# Patient Record
Sex: Female | Born: 1950 | Race: Black or African American | Hispanic: No | Smoking: Never smoker
Health system: Southern US, Community
[De-identification: ages and names within clinical notes are randomized; demographics above are authoritative.]

## PROBLEM LIST (undated history)

## (undated) DIAGNOSIS — Z8719 Personal history of other diseases of the digestive system: Secondary | ICD-10-CM

## (undated) DIAGNOSIS — J189 Pneumonia, unspecified organism: Secondary | ICD-10-CM

## (undated) DIAGNOSIS — D649 Anemia, unspecified: Secondary | ICD-10-CM

## (undated) DIAGNOSIS — K219 Gastro-esophageal reflux disease without esophagitis: Secondary | ICD-10-CM

## (undated) DIAGNOSIS — K8681 Exocrine pancreatic insufficiency: Secondary | ICD-10-CM

## (undated) DIAGNOSIS — R0600 Dyspnea, unspecified: Secondary | ICD-10-CM

## (undated) HISTORY — DX: Anemia, unspecified: D64.9

## (undated) HISTORY — DX: Gastro-esophageal reflux disease without esophagitis: K21.9

---

## 1994-06-17 HISTORY — PX: TONSILLECTOMY: SUR1361

## 1996-06-17 HISTORY — PX: BREAST SURGERY: SHX581

## 1996-06-17 HISTORY — PX: BREAST EXCISIONAL BIOPSY: SUR124

## 1996-06-17 HISTORY — PX: ABDOMINAL HYSTERECTOMY: SHX81

## 2004-09-04 ENCOUNTER — Ambulatory Visit: Payer: Self-pay

## 2005-12-09 ENCOUNTER — Ambulatory Visit: Payer: Self-pay | Admitting: Family Medicine

## 2005-12-10 ENCOUNTER — Ambulatory Visit: Payer: Self-pay | Admitting: Gastroenterology

## 2007-03-28 ENCOUNTER — Ambulatory Visit: Payer: Self-pay | Admitting: Family Medicine

## 2009-06-17 HISTORY — PX: COLONOSCOPY: SHX174

## 2010-02-02 ENCOUNTER — Inpatient Hospital Stay: Payer: Self-pay | Admitting: *Deleted

## 2010-04-05 ENCOUNTER — Ambulatory Visit: Payer: Self-pay | Admitting: Gastroenterology

## 2011-03-23 ENCOUNTER — Emergency Department: Payer: Self-pay | Admitting: Unknown Physician Specialty

## 2012-05-25 ENCOUNTER — Ambulatory Visit: Payer: Self-pay | Admitting: Family Medicine

## 2012-06-24 ENCOUNTER — Ambulatory Visit: Payer: Self-pay | Admitting: Family Medicine

## 2013-08-20 ENCOUNTER — Ambulatory Visit: Payer: Self-pay | Admitting: Family Medicine

## 2013-09-03 ENCOUNTER — Ambulatory Visit: Payer: Self-pay | Admitting: Family Medicine

## 2014-09-14 ENCOUNTER — Ambulatory Visit
Admit: 2014-09-14 | Disposition: A | Discharge status: Home / Self Care | Payer: Self-pay | Attending: Family Medicine | Admitting: Family Medicine

## 2014-09-26 ENCOUNTER — Ambulatory Visit
Admit: 2014-09-26 | Disposition: A | Discharge status: Home / Self Care | Payer: Self-pay | Attending: Family Medicine | Admitting: Family Medicine

## 2014-10-05 ENCOUNTER — Encounter: Payer: Self-pay | Admitting: *Deleted

## 2014-10-17 ENCOUNTER — Other Ambulatory Visit: Payer: 59

## 2014-10-17 ENCOUNTER — Encounter: Payer: Self-pay | Admitting: General Surgery

## 2014-10-17 ENCOUNTER — Ambulatory Visit (INDEPENDENT_AMBULATORY_CARE_PROVIDER_SITE_OTHER): Payer: 59 | Admitting: General Surgery

## 2014-10-17 VITALS — BP 116/68 | HR 72 | Resp 12 | Ht 65.0 in | Wt 168.0 lb

## 2014-10-17 DIAGNOSIS — N6002 Solitary cyst of left breast: Secondary | ICD-10-CM | POA: Diagnosis not present

## 2014-10-17 HISTORY — PX: BREAST CYST ASPIRATION: SHX578

## 2014-10-17 NOTE — Progress Notes (Signed)
Patient ID: Trace J Lievanos, female   DOB: [birth year], [age][name] y.o.   MRN: [id number]  Chief Complaint  Patient presents with  . Other    cyst on left breast    HPI Shon HoughBarbara J Blane is a [age] y.o. female here for evaluation of a left breast cyst. Her last mammogram was done on 09/26/14. She has a family history of breast cancer. Patient states she did not feel cyst it was seen on the ultrasound. She says her breast just feel heavier.  HPI  Past Medical History  Diagnosis Date  . GERD (gastroesophageal reflux disease)   . Anemia     Past Surgical History  Procedure Laterality Date  . Tonsillectomy  1996  . Abdominal hysterectomy  1998    partial  . Colonoscopy  2011  . Breast surgery Left 1998    removal of 2 fibrocystic masses    Family History  Problem Relation Age of Onset  . Cancer Mother     pancreatic  . Alzheimer's disease Father   . Cancer Sister     lung  . Cancer Brother     lung  . Breast cancer Sister     2261    Social History History  Substance Use Topics  . Smoking status: Never Smoker   . Smokeless tobacco: Never Used  . Alcohol Use: No    No Known Allergies  Current Outpatient Prescriptions  Medication Sig Dispense Refill  . esomeprazole (NEXIUM) 20 MG capsule Take 20 mg by mouth daily at 12 noon.    . Omega-3 Fatty Acids (FP FISH OIL PO) Take 1 capsule by mouth every other day.      No current facility-administered medications for this visit.    Review of Systems Review of Systems  Constitutional: Negative.   Respiratory: Negative.   Cardiovascular: Negative.     Blood pressure 116/68, pulse 72, resp. rate 12, height 5\' 5"  (1.651 m), weight 168 lb (76.204 kg).  Physical Exam Physical Exam  Constitutional: She is oriented to person, place, and time. She appears well-developed and well-nourished.  Eyes: Conjunctivae are normal. No scleral icterus.  Neck: Neck supple.  Cardiovascular: Normal rate, regular rhythm and normal heart sounds.    Pulmonary/Chest: Effort normal and breath sounds normal. Right breast exhibits no inverted nipple, no mass, no nipple discharge, no skin change and no tenderness. Left breast exhibits no inverted nipple, no mass, no nipple discharge, no skin change and no tenderness.  Lymphadenopathy:    She has no cervical adenopathy.    She has no axillary adenopathy.  Neurological: She is alert and oriented to person, place, and time.    Data Reviewed Mammogram and Ultrasound reviewed. 7 mm anechoic mass left breast 8 o'clock. Suspected to be a cyst.   Assessment    Left breast cyst. Patient was concerned given her family history.     Plan    With consent the left breast cyst was aspirated and resolved with ultrasound guidance.  Recheck in 3 months with ultrasound.     Ref/PCP: Dr Ilene QuaHawkins   SANKAR,SEEPLAPUTHUR G 10/18/2014, 5:57 AM

## 2014-10-18 ENCOUNTER — Encounter: Payer: Self-pay | Admitting: General Surgery

## 2014-10-18 NOTE — Patient Instructions (Signed)
Call for any problems or questions.

## 2014-12-14 ENCOUNTER — Encounter: Payer: Self-pay | Admitting: *Deleted

## 2015-02-05 ENCOUNTER — Emergency Department
Admission: EM | Admit: 2015-02-05 | Discharge: 2015-02-05 | Disposition: A | Discharge status: Home / Self Care | Payer: 59 | Attending: Emergency Medicine | Admitting: Emergency Medicine

## 2015-02-05 DIAGNOSIS — IMO0001 Reserved for inherently not codable concepts without codable children: Secondary | ICD-10-CM

## 2015-02-05 DIAGNOSIS — M79641 Pain in right hand: Secondary | ICD-10-CM | POA: Diagnosis present

## 2015-02-05 DIAGNOSIS — L03011 Cellulitis of right finger: Secondary | ICD-10-CM | POA: Insufficient documentation

## 2015-02-05 MED ORDER — OXYCODONE-ACETAMINOPHEN 5-325 MG PO TABS: 1.0000 | ORAL_TABLET | ORAL | Status: DC | PRN

## 2015-02-05 MED ORDER — CEPHALEXIN 500 MG PO CAPS: 500.0000 mg | ORAL_CAPSULE | Freq: Three times a day (TID) | ORAL | Status: DC

## 2015-02-05 MED ORDER — SULFAMETHOXAZOLE-TRIMETHOPRIM 800-160 MG PO TABS: 1.0000 | ORAL_TABLET | Freq: Two times a day (BID) | ORAL | Status: DC

## 2015-02-05 MED ORDER — OXYCODONE-ACETAMINOPHEN 5-325 MG PO TABS
1.0000 | ORAL_TABLET | Freq: Once | ORAL | Status: AC
  Administered 2015-02-05: 1 via ORAL
  Filled 2015-02-05: qty 1

## 2015-02-05 NOTE — ED Notes (Addendum)
Patient to ED with c/o infection to 3rd digit right hand. Patient reports that this happened after a manicure and she has been using an anti-fungal.

## 2015-02-05 NOTE — ED Provider Notes (Signed)
[facility] Emergency Department Provider Note  ____________________________________________  Time seen: Approximately 8:40 AM  I have reviewed the triage vital signs and the nursing notes.   HISTORY  Chief Complaint Hand Pain   HPI Sophia Schmidt is a 64 y.o. female with infection third finger right hand.Patient states that she had a manicure and she does have acrylic nails. She began having pain right third finger and return to the nail salon. She was given some ointment to use which has not helped any at all. Today the pain is increased. She rates her pain a 10 out of 10 at present.   Past Medical History  Diagnosis Date  . GERD (gastroesophageal reflux disease)   . Anemia     There are no active problems to display for this patient.   Past Surgical History  Procedure Laterality Date  . Tonsillectomy  1996  . Abdominal hysterectomy  1998    partial  . Colonoscopy  2011  . Breast surgery Left 1998    removal of 2 fibrocystic masses    Current Outpatient Rx  Name  Route  Sig  Dispense  Refill  . cephALEXin (KEFLEX) 500 MG capsule   Oral   Take 1 capsule (500 mg total) by mouth 3 (three) times daily.   21 capsule   0   . esomeprazole (NEXIUM) 20 MG capsule   Oral   Take 20 mg by mouth daily at 12 noon.         . Omega-3 Fatty Acids (FP FISH OIL PO)   Oral   Take 1 capsule by mouth every other day.          . oxyCODONE-acetaminophen (PERCOCET) 5-325 MG per tablet   Oral   Take 1 tablet by mouth every 4 (four) hours as needed for severe pain.   20 tablet   0   . sulfamethoxazole-trimethoprim (BACTRIM DS,SEPTRA DS) 800-160 MG per tablet   Oral   Take 1 tablet by mouth 2 (two) times daily.   14 tablet   0     Allergies Review of patient's allergies indicates no known allergies.  Family History  Problem Relation Age of Onset  . Cancer Mother     pancreatic  . Alzheimer's disease Father   . Cancer Sister     lung  .  Cancer Brother     lung  . Breast cancer Sister     45    Social History Social History  Substance Use Topics  . Smoking status: Never Smoker   . Smokeless tobacco: Never Used  . Alcohol Use: No    Review of Systems Constitutional: No fever/chills ENT: No sore throat. Cardiovascular: Denies chest pain. Respiratory: Denies shortness of breath. Gastrointestinal: No abdominal pain.  No nausea, no vomiting.  Genitourinary: Negative for dysuria. Musculoskeletal: Negative for back pain. Skin: Negative for rash. Neurological: Negative for headaches, focal weakness or numbness.  10-point ROS otherwise negative.  ____________________________________________   PHYSICAL EXAM:  VITAL SIGNS: ED Triage Vitals  Enc Vitals Group     BP 02/05/15 0836 158/96 mmHg     Pulse Rate 02/05/15 0836 81     Resp 02/05/15 0836 18     Temp 02/05/15 0836 98.6 F (37 C)     Temp Source 02/05/15 0836 Oral     SpO2 02/05/15 0836 100 %     Weight 02/05/15 0836 160 lb (72.576 kg)     Height 02/05/15 0836 5\' 5"  (1.651  m)     Head Cir --      Peak Flow --      Pain Score 02/05/15 0836 10     Pain Loc --      Pain Edu? --      Excl. in GC? --     Constitutional: Alert and oriented. Well appearing and in no acute distress. Eyes: Conjunctivae are normal. PERRL. EOMI. Head: Atraumatic. Nose: No congestion/rhinnorhea. Neck: No stridor.   Cardiovascular: Normal rate, regular rhythm. Grossly normal heart sounds.  Good peripheral circulation. Respiratory: Normal respiratory effort.  No retractions. Lungs CTAB. Gastrointestinal: Soft and nontender. No distention Musculoskeletal: No lower extremity tenderness nor edema.  No joint effusions. Neurologic:  Normal speech and language. No gross focal neurologic deficits are appreciated. No gait instability. Skin:  Skin is warm, dry and intact. There is moderate tenderness on palpation distal portion of the right third finger with obvious purulent matter  under the skin. Psychiatric: Mood and affect are normal. Speech and behavior are normal.  ____________________________________________   LABS (all labs ordered are listed, but only abnormal results are displayed)  Labs Reviewed - No data to display   PROCEDURES  Procedure(s) performed: Area was prepped with alcohol wipe. Incision was made with an 18-gauge needle and moderate amount of purulent material drained.  Critical Care performed: No  ____________________________________________   INITIAL IMPRESSION / ASSESSMENT AND PLAN / ED COURSE  Pertinent labs & imaging results that were available during my care of the patient were reviewed by me and considered in my medical decision making (see chart for details).  Patient was instructed to go home and soak her finger in warm water. She is placed on Keflex, Septra DS, and Percocet. She is return if any continued problems. ____________________________________________   FINAL CLINICAL IMPRESSION(S) / ED DIAGNOSES  Final diagnoses:  Paronychia of third finger of right hand      Tommi Rumps, PA-C 02/05/15 1052  Minna Antis, MD 02/05/15 1526

## 2015-02-05 NOTE — ED Notes (Signed)
NAD noted at time of D/C. Pt denies questions or concerns. Pt ambulatory to the lobby at this time.  

## 2015-02-07 ENCOUNTER — Ambulatory Visit: Payer: Self-pay | Admitting: General Surgery

## 2015-02-15 ENCOUNTER — Encounter: Payer: Self-pay | Admitting: Family Medicine

## 2015-02-15 ENCOUNTER — Ambulatory Visit (INDEPENDENT_AMBULATORY_CARE_PROVIDER_SITE_OTHER): Payer: 59 | Admitting: Family Medicine

## 2015-02-15 VITALS — BP 124/85 | HR 70 | Temp 98.3°F | Resp 16 | Ht 65.0 in | Wt 161.0 lb

## 2015-02-15 DIAGNOSIS — L509 Urticaria, unspecified: Secondary | ICD-10-CM

## 2015-02-15 DIAGNOSIS — K219 Gastro-esophageal reflux disease without esophagitis: Secondary | ICD-10-CM | POA: Insufficient documentation

## 2015-02-15 NOTE — Progress Notes (Signed)
Name: Sophia Schmidt   MRN: [id number]    DOB: May 27, 1951   Date:02/15/2015       Progress Note  Subjective  Chief Complaint  Chief Complaint  Patient presents with  . Rash    per pt rash over entire body; she thinks she may have come in contact with something.    HPI  Rash x 24 hrs.  Started at work.  Itchy.  Started on arms.  Now all over.  Doesn't know of new exposures.  No new meds.   No problem-specific assessment & plan notes found for this encounter.   Past Medical History  Diagnosis Date  . GERD (gastroesophageal reflux disease)   . Anemia     Social History  Substance Use Topics  . Smoking status: Never Smoker   . Smokeless tobacco: Never Used  . Alcohol Use: No     Current outpatient prescriptions:  .  esomeprazole (NEXIUM) 20 MG capsule, Take 20 mg by mouth daily at 12 noon., Disp: , Rfl:  .  Omega-3 Fatty Acids (FP FISH OIL PO), Take 1 capsule by mouth every other day. , Disp: , Rfl:   No Known Allergies  Review of Systems  Constitutional: Negative for fever and chills.  HENT: Negative for hearing loss.   Eyes: Negative for blurred vision and double vision.  Respiratory: Negative for cough, sputum production, shortness of breath and wheezing.   Cardiovascular: Negative for chest pain and leg swelling.  Gastrointestinal: Negative for abdominal pain.  Genitourinary: Negative for dysuria, urgency and frequency.  Musculoskeletal: Negative for myalgias and joint pain.  Skin: Positive for itching and rash.  Neurological: Negative for headaches.      Objective  Filed Vitals:   02/15/15 0807  BP: 124/85  Pulse: 70  Temp: 98.3 F (36.8 C)  TempSrc: Oral  Resp: 16  Height:  (1.651 m)  Weight: 161 lb (73.029 kg)     Physical Exam  Constitutional: She is well-developed, well-nourished, and in no distress. No distress.  Cardiovascular: Normal rate, regular rhythm, normal heart sounds and intact distal pulses.  Exam reveals no gallop and no  friction rub.   No murmur heard. Pulmonary/Chest: Effort normal and breath sounds normal. No respiratory distress. She has no wheezes. She has no rales.  Skin:  Mild urticarial rash over bilateral arms, legs, some on neck.  None on face or palms or soles.  Itchy.  Vitals reviewed.     No results found for this or any previous visit (from the past 2160 hour(s)).   Assessment & Plan  .1. Urticaria

## 2015-02-15 NOTE — Patient Instructions (Addendum)
Use Claritin, 10 mg., 1 daily; Benadryl 25 mg., 1 in afternoon and 1 in evening; Ranitidine (Zantac) 150 mg., 1 twice a day./  All OTC Use cool skin moisturizers.

## 2015-03-16 ENCOUNTER — Encounter: Payer: Self-pay | Admitting: *Deleted

## 2015-04-03 ENCOUNTER — Ambulatory Visit (INDEPENDENT_AMBULATORY_CARE_PROVIDER_SITE_OTHER): Payer: 59 | Admitting: Family Medicine

## 2015-04-03 ENCOUNTER — Encounter: Payer: Self-pay | Admitting: Family Medicine

## 2015-04-03 VITALS — BP 136/88 | HR 73 | Temp 98.8°F | Resp 16 | Ht 65.0 in | Wt 165.6 lb

## 2015-04-03 DIAGNOSIS — H109 Unspecified conjunctivitis: Secondary | ICD-10-CM | POA: Diagnosis not present

## 2015-04-03 MED ORDER — POLYMYXIN B-TRIMETHOPRIM 10000-0.1 UNIT/ML-% OP SOLN: 1.0000 [drp] | Freq: Four times a day (QID) | OPHTHALMIC | Status: DC

## 2015-04-03 NOTE — Progress Notes (Signed)
Date:  04/03/2015   Name:  Sophia HoughBarbara J Schmidt   DOB:  1951/04/28   MRN:  [id number]  PCP:  Fidel LevyJames Hawkins Jr, MD    Chief Complaint: Conjunctivitis   History of Present Illness:  This is a 64 y.o. female reports 4d hx right eye redness with watery discharge, has been using old ofloxacin drops without benefit, denies vision loss or significant pain, no URI sxs.  Review of Systems:  Review of Systems  Constitutional: Negative for fever and chills.  HENT: Negative for facial swelling, rhinorrhea and sore throat.   Eyes: Negative for photophobia and visual disturbance.  Respiratory: Negative for cough.     Patient Active Problem List   Diagnosis Date Noted  . GERD (gastroesophageal reflux disease) 02/15/2015    Prior to Admission medications   Medication Sig Start Date End Date Taking? Authorizing Provider  esomeprazole (NEXIUM) 20 MG capsule Take 20 mg by mouth daily at 12 noon.   Yes Historical Provider, MD  Omega-3 Fatty Acids (FP FISH OIL PO) Take 1 capsule by mouth every other day.    Yes Historical Provider, MD  trimethoprim-polymyxin b (POLYTRIM) ophthalmic solution Place 1 drop into the right eye 4 (four) times daily. 04/03/15   Schuyler AmorWilliam Zhaire Locker, MD    No Known Allergies  Past Surgical History  Procedure Laterality Date  . Tonsillectomy  1996  . Abdominal hysterectomy  1998    partial  . Colonoscopy  2011  . Breast surgery Left 1998    removal of 2 fibrocystic masses    Social History  Substance Use Topics  . Smoking status: Never Smoker   . Smokeless tobacco: Never Used  . Alcohol Use: No    Family History  Problem Relation Age of Onset  . Cancer Mother     pancreatic  . Alzheimer's disease Father   . Cancer Sister     lung  . Cancer Brother     lung  . Breast cancer Sister     3961    Medication list has been reviewed and updated.  Physical Examination: BP 136/88 mmHg  Pulse 73  Temp(Src) 98.8 F (37.1 C) (Oral)  Resp 16  Ht 5\' 5"  (1.651 m)  Wt  165 lb 9.6 oz (75.116 kg)  BMI 27.56 kg/m2  Physical Exam  Constitutional: She appears well-developed and well-nourished.  Eyes: EOM are normal. Pupils are equal, round, and reactive to light.  R conjunctiva markedly injected L conjuctiva unremarkable  Neurological: She is alert.  Skin: Skin is warm and dry.  Psychiatric: She has a normal mood and affect. Her behavior is normal.  Nursing note and vitals reviewed.   Assessment and Plan:  1. Conjunctivitis of right eye Marked injection with minimal d/c but no red flags for iritis or glaucoma noted. Will change abx gtts, encourage pt to call if sxs fail to improve over next 48h - trimethoprim-polymyxin b (POLYTRIM) ophthalmic solution; Place 1 drop into the right eye 4 (four) times daily.  Dispense: 10 mL; Refill: 0   Return if symptoms worsen or fail to improve.  Dionne AnoWilliam M. Kingsley SpittlePlonk, Jr. MD [facility]  04/03/2015

## 2015-04-05 ENCOUNTER — Telehealth: Payer: Self-pay | Admitting: Family Medicine

## 2015-04-05 NOTE — Telephone Encounter (Signed)
Pt needs the most recent lab results for her insurance.  She would like to pick that up tomorrow.  Her call back number is [phone]

## 2015-04-05 NOTE — Telephone Encounter (Signed)
Labs printed and patient contacted.Sophia Schmidt

## 2015-05-05 ENCOUNTER — Ambulatory Visit
Admission: RE | Admit: 2015-05-05 | Discharge: 2015-05-05 | Disposition: A | Discharge status: Home / Self Care | Payer: 59 | Source: Ambulatory Visit | Attending: Family Medicine | Admitting: Family Medicine

## 2015-05-05 ENCOUNTER — Encounter: Payer: Self-pay | Admitting: Family Medicine

## 2015-05-05 ENCOUNTER — Ambulatory Visit (INDEPENDENT_AMBULATORY_CARE_PROVIDER_SITE_OTHER): Payer: 59 | Admitting: Family Medicine

## 2015-05-05 VITALS — BP 132/81 | HR 78 | Resp 16 | Ht 66.0 in | Wt 164.0 lb

## 2015-05-05 DIAGNOSIS — M79605 Pain in left leg: Secondary | ICD-10-CM | POA: Insufficient documentation

## 2015-05-05 DIAGNOSIS — M25562 Pain in left knee: Secondary | ICD-10-CM | POA: Insufficient documentation

## 2015-05-05 MED ORDER — HYDROCODONE-ACETAMINOPHEN 5-325 MG PO TABS: 1.0000 | ORAL_TABLET | Freq: Four times a day (QID) | ORAL | Status: DC | PRN

## 2015-05-05 NOTE — Patient Instructions (Addendum)
Rest and stay off leg as much as possible this weekend.

## 2015-05-05 NOTE — Progress Notes (Signed)
Name: Sophia HoughBarbara J Schmidt   MRN: [id number]    DOB: 04/13/1951   Date:05/05/2015       Progress Note  Subjective  Chief Complaint  Chief Complaint  Patient presents with  . Leg Pain    1-2 months L Leg pain No Injury    Leg Pain  Associated symptoms include tingling (L foot.).  L leg pain x 6 months.  No injury.  Tried Ibuprofen/ Aleve.  Pain starts in L knee laterally and goes down shin to foot.  Foot tingles now.  No swelling.  No calf pain.   No x-rays done yet.  Hurts all the time.  Hard to stand up.   No problem-specific assessment & plan notes found for this encounter.   Past Medical History  Diagnosis Date  . GERD (gastroesophageal reflux disease)   . Anemia     Social History  Substance Use Topics  . Smoking status: Never Smoker   . Smokeless tobacco: Never Used  . Alcohol Use: No     Current outpatient prescriptions:  .  esomeprazole (NEXIUM) 20 MG capsule, Take 20 mg by mouth daily at 12 noon., Disp: , Rfl:  .  ibuprofen (ADVIL,MOTRIN) 100 MG tablet, Take 800 mg by mouth every 6 (six) hours as needed for fever., Disp: , Rfl:  .  Omega-3 Fatty Acids (FP FISH OIL PO), Take 1 capsule by mouth every other day. , Disp: , Rfl:   No Known Allergies  Review of Systems  Constitutional: Negative for fever, chills, weight loss and malaise/fatigue.  HENT: Negative for hearing loss.   Eyes: Negative for blurred vision and double vision.  Respiratory: Negative for cough, shortness of breath and wheezing.   Cardiovascular: Negative for chest pain, palpitations and leg swelling.  Gastrointestinal: Negative for heartburn, abdominal pain and blood in stool.  Genitourinary: Negative for dysuria, urgency and frequency.  Musculoskeletal:       L leg pain  Skin: Negative for rash.  Neurological: Positive for tingling (L foot.). Negative for weakness and headaches.      Objective  Filed Vitals:   05/05/15 1422  BP: 132/81  Pulse: 78  Resp: 16  Height: 5\' 6"  (1.676 m)   Weight: 164 lb (74.39 kg)     Physical Exam  Constitutional: She appears distressed (in distress with L leg pain).  HENT:  Head: Normocephalic and atraumatic.  Musculoskeletal:  Pain to palp. Of L. Lat knee and lateral to tibia all the way down leg to ankle.  ROM of knee reproduces the pain also.  Vitals reviewed.     No results found for this or any previous visit (from the past 2160 hour(s)).   Assessment & Plan  1. Leg pain, left  - DG Tibia/Fibula Left; Future - DG Knee Complete 4 Views Left; Future - HYDROcodone-acetaminophen (NORCO) 5-325 MG tablet; Take 1 tablet by mouth every 6 (six) hours as needed for moderate pain.  Dispense: 30 tablet; Refill: 0 - Ambulatory referral to Orthopedic Surgery

## 2015-05-24 DIAGNOSIS — M7070 Other bursitis of hip, unspecified hip: Secondary | ICD-10-CM | POA: Insufficient documentation

## 2015-05-24 DIAGNOSIS — M5416 Radiculopathy, lumbar region: Secondary | ICD-10-CM | POA: Insufficient documentation

## 2015-12-07 ENCOUNTER — Other Ambulatory Visit: Payer: Self-pay | Admitting: Physician Assistant

## 2015-12-07 DIAGNOSIS — R609 Edema, unspecified: Secondary | ICD-10-CM

## 2015-12-07 DIAGNOSIS — M25561 Pain in right knee: Secondary | ICD-10-CM

## 2015-12-08 ENCOUNTER — Ambulatory Visit
Admission: RE | Admit: 2015-12-08 | Discharge: 2015-12-08 | Disposition: A | Discharge status: Home / Self Care | Payer: Worker's Compensation | Source: Ambulatory Visit | Attending: Physician Assistant | Admitting: Physician Assistant

## 2015-12-08 DIAGNOSIS — R609 Edema, unspecified: Secondary | ICD-10-CM

## 2015-12-08 DIAGNOSIS — M25561 Pain in right knee: Secondary | ICD-10-CM

## 2016-04-01 ENCOUNTER — Ambulatory Visit (INDEPENDENT_AMBULATORY_CARE_PROVIDER_SITE_OTHER): Payer: 59 | Admitting: Family Medicine

## 2016-04-01 ENCOUNTER — Encounter: Payer: Self-pay | Admitting: Family Medicine

## 2016-04-01 VITALS — BP 135/85 | HR 58 | Temp 97.9°F | Resp 16 | Ht 66.0 in | Wt 157.0 lb

## 2016-04-01 DIAGNOSIS — Z1231 Encounter for screening mammogram for malignant neoplasm of breast: Secondary | ICD-10-CM

## 2016-04-01 DIAGNOSIS — Z1239 Encounter for other screening for malignant neoplasm of breast: Secondary | ICD-10-CM

## 2016-04-01 DIAGNOSIS — Z0000 Encounter for general adult medical examination without abnormal findings: Secondary | ICD-10-CM | POA: Diagnosis not present

## 2016-04-01 DIAGNOSIS — K219 Gastro-esophageal reflux disease without esophagitis: Secondary | ICD-10-CM | POA: Diagnosis not present

## 2016-04-01 DIAGNOSIS — Z23 Encounter for immunization: Secondary | ICD-10-CM | POA: Diagnosis not present

## 2016-04-01 LAB — COMPLETE METABOLIC PANEL WITH GFR
ALBUMIN: 4.3 g/dL (ref 3.6–5.1)
ALT: 10 U/L (ref 6–29)
AST: 11 U/L (ref 10–35)
Alkaline Phosphatase: 96 U/L (ref 33–130)
BILIRUBIN TOTAL: 0.4 mg/dL (ref 0.2–1.2)
BUN: 6 mg/dL — ABNORMAL LOW (ref 7–25)
CO2: 27 mmol/L (ref 20–31)
Calcium: 10.6 mg/dL — ABNORMAL HIGH (ref 8.6–10.4)
Chloride: 108 mmol/L (ref 98–110)
Creat: 0.48 mg/dL — ABNORMAL LOW (ref 0.50–0.99)
GLUCOSE: 85 mg/dL (ref 65–99)
POTASSIUM: 4.2 mmol/L (ref 3.5–5.3)
SODIUM: 143 mmol/L (ref 135–146)
TOTAL PROTEIN: 6.9 g/dL (ref 6.1–8.1)

## 2016-04-01 LAB — CBC WITH DIFFERENTIAL/PLATELET
BASOS PCT: 1 %
Basophils Absolute: 58 cells/uL (ref 0–200)
EOS PCT: 2 %
Eosinophils Absolute: 116 cells/uL (ref 15–500)
HCT: 36.1 % (ref 35.0–45.0)
HEMOGLOBIN: 11.9 g/dL (ref 11.7–15.5)
LYMPHS ABS: 2552 {cells}/uL (ref 850–3900)
Lymphocytes Relative: 44 %
MCH: 26.3 pg — ABNORMAL LOW (ref 27.0–33.0)
MCHC: 33 g/dL (ref 32.0–36.0)
MCV: 79.7 fL — ABNORMAL LOW (ref 80.0–100.0)
MONOS PCT: 6 %
MPV: 10.2 fL (ref 7.5–12.5)
Monocytes Absolute: 348 cells/uL (ref 200–950)
NEUTROS ABS: 2726 {cells}/uL (ref 1500–7800)
Neutrophils Relative %: 47 %
PLATELETS: 306 10*3/uL (ref 140–400)
RBC: 4.53 MIL/uL (ref 3.80–5.10)
RDW: 14.6 % (ref 11.0–15.0)
WBC: 5.8 10*3/uL (ref 3.8–10.8)

## 2016-04-01 LAB — LIPID PANEL
CHOL/HDL RATIO: 3.4 ratio (ref <=5.0)
CHOLESTEROL: 203 mg/dL — AB (ref 125–200)
HDL: 60 mg/dL (ref >=46)
LDL Cholesterol: 126 mg/dL (ref <130)
TRIGLYCERIDES: 84 mg/dL (ref <150)
VLDL: 17 mg/dL (ref <30)

## 2016-04-01 NOTE — Progress Notes (Signed)
Name: Sophia Schmidt   MRN: [id number]    DOB: 08/12/1950   Date:04/01/2016       Progress Note  Subjective  Chief Complaint  Chief Complaint  Patient presents with  . Annual Exam    HPI Here for annual female physical exam.  Nothing really bothering her except her R lateral foot bothers her at work sometimes.  Trying new shoes.  No problem-specific Assessment & Plan notes found for this encounter.   Past Medical History:  Diagnosis Date  . Anemia   . GERD (gastroesophageal reflux disease)     Past Surgical History:  Procedure Laterality Date  . ABDOMINAL HYSTERECTOMY  1998   partial  . BREAST SURGERY Left 1998   removal of 2 fibrocystic masses  . COLONOSCOPY  2011  . TONSILLECTOMY  1996    Family History  Problem Relation Age of Onset  . Cancer Mother     pancreatic  . Alzheimer's disease Father   . Cancer Sister     lung  . Cancer Brother     lung  . Breast cancer Sister     50    Social History   Social History  . Marital status: Divorced    Spouse name: N/A  . Number of children: N/A  . Years of education: N/A   Occupational History  . Not on file.   Social History Main Topics  . Smoking status: Never Smoker  . Smokeless tobacco: Never Used  . Alcohol use No  . Drug use: No  . Sexual activity: Not on file   Other Topics Concern  . Not on file   Social History Narrative  . No narrative on file     Current Outpatient Prescriptions:  .  esomeprazole (NEXIUM) 20 MG capsule, Take 20 mg by mouth daily at 12 noon., Disp: , Rfl:   Not on File   Review of Systems  Constitutional: Negative for chills, fever, malaise/fatigue and weight loss.  HENT: Negative for hearing loss.   Eyes: Negative for blurred vision and double vision.  Respiratory: Negative for cough, shortness of breath and wheezing.   Cardiovascular: Negative for chest pain, palpitations and leg swelling.  Gastrointestinal: Negative for abdominal pain, blood in stool and  heartburn.  Genitourinary: Negative for dysuria, frequency and urgency.  Musculoskeletal: Negative for back pain, joint pain and myalgias.  Skin: Negative for rash.  Neurological: Negative for dizziness, tingling, tremors, weakness and headaches.  Psychiatric/Behavioral: Negative for depression. The patient is not nervous/anxious and does not have insomnia.       Objective  Vitals:   04/01/16 1402 04/01/16 1429  BP: (!) 147/92 135/85  Pulse: (!) 58   Resp: 16   Temp: 97.9 F (36.6 C)   TempSrc: Oral   Weight: 157 lb (71.2 kg)   Height: 5\' 6"  (1.676 m)     Physical Exam  Constitutional: She is oriented to person, place, and time and well-developed, well-nourished, and in no distress. No distress.  HENT:  Head: Normocephalic and atraumatic.  Right Ear: External ear normal.  Left Ear: External ear normal.  Nose: Nose normal.  Mouth/Throat: Oropharynx is clear and moist.  Eyes: EOM are normal. Pupils are equal, round, and reactive to light. No scleral icterus.  Fundoscopic exam:      The right eye shows no arteriolar narrowing, no AV nicking, no exudate, no hemorrhage and no papilledema.       The left eye shows no arteriolar narrowing, no AV  nicking, no exudate, no hemorrhage and no papilledema.  Neck: Normal range of motion. Neck supple. Carotid bruit is not present. No thyromegaly present.  Cardiovascular: Normal rate, regular rhythm, normal heart sounds and intact distal pulses.  Exam reveals no gallop and no friction rub.   No murmur heard. Pulmonary/Chest: Effort normal and breath sounds normal. No respiratory distress. She has no wheezes. She has no rales. She exhibits no tenderness. Right breast exhibits no inverted nipple, no mass, no nipple discharge, no skin change and no tenderness. Left breast exhibits no inverted nipple, no mass, no nipple discharge, no skin change and no tenderness. Breasts are symmetrical.  Abdominal: Soft. Bowel sounds are normal. She exhibits no  distension, no abdominal bruit and no mass. There is no tenderness. There is no rebound and no guarding.  Genitourinary: Vagina normal, right adnexa normal and left adnexa normal. No vaginal discharge found.  Genitourinary Comments: Cx and Ut surgically absent.  Musculoskeletal: Normal range of motion. She exhibits no edema.  Lymphadenopathy:    She has no cervical adenopathy.  Neurological: She is alert and oriented to person, place, and time.  Skin: Skin is warm and dry. No rash noted. No erythema.  Psychiatric: Mood, memory, affect and judgment normal.  Vitals reviewed.      No results found for this or any previous visit (from the past 2160 hour(s)).   Assessment & Plan  Problem List Items Addressed This Visit      Digestive   GERD (gastroesophageal reflux disease)   Relevant Orders   CBC with Differential     Other   Healthcare maintenance - Primary   Relevant Orders   COMPLETE METABOLIC PANEL WITH GFR   Lipid Profile    Other Visit Diagnoses    Immunization due       Relevant Orders   Flu Vaccine QUAD 36+ mos PF IM (Fluarix & Fluzone Quad PF)      No orders of the defined types were placed in this encounter. 1. Healthcare maintenance  - COMPLETE METABOLIC PANEL WITH GFR - Lipid Profile  2. Gastroesophageal reflux disease without esophagitis Cont Nexium - CBC with Differential  3. Immunization due  - Flu Vaccine QUAD 36+ mos PF IM (Fluarix & Fluzone Quad PF)

## 2016-06-03 ENCOUNTER — Ambulatory Visit: Payer: 59 | Admitting: Family Medicine

## 2016-09-24 ENCOUNTER — Telehealth: Payer: Self-pay | Admitting: Family Medicine

## 2016-09-24 NOTE — Telephone Encounter (Signed)
Patient contacted and told these would be ordered at 4/17 appointment.

## 2016-09-24 NOTE — Telephone Encounter (Signed)
Pt said it was time for her colonoscopy and mammogram.  Her call back number is [phone]

## 2016-10-01 ENCOUNTER — Encounter: Payer: Self-pay | Admitting: Family Medicine

## 2016-10-01 ENCOUNTER — Other Ambulatory Visit: Payer: Self-pay | Admitting: Family Medicine

## 2016-10-01 ENCOUNTER — Ambulatory Visit: Payer: 59 | Admitting: Family Medicine

## 2016-10-01 ENCOUNTER — Ambulatory Visit (INDEPENDENT_AMBULATORY_CARE_PROVIDER_SITE_OTHER): Payer: 59 | Admitting: Family Medicine

## 2016-10-01 VITALS — BP 114/76 | HR 76 | Temp 98.4°F | Resp 16 | Ht 66.0 in | Wt 161.0 lb

## 2016-10-01 DIAGNOSIS — Z131 Encounter for screening for diabetes mellitus: Secondary | ICD-10-CM

## 2016-10-01 DIAGNOSIS — K219 Gastro-esophageal reflux disease without esophagitis: Secondary | ICD-10-CM | POA: Diagnosis not present

## 2016-10-01 DIAGNOSIS — Z862 Personal history of diseases of the blood and blood-forming organs and certain disorders involving the immune mechanism: Secondary | ICD-10-CM

## 2016-10-01 DIAGNOSIS — Z114 Encounter for screening for human immunodeficiency virus [HIV]: Secondary | ICD-10-CM

## 2016-10-01 DIAGNOSIS — Z1239 Encounter for other screening for malignant neoplasm of breast: Secondary | ICD-10-CM

## 2016-10-01 DIAGNOSIS — Z0000 Encounter for general adult medical examination without abnormal findings: Secondary | ICD-10-CM

## 2016-10-01 DIAGNOSIS — Z1211 Encounter for screening for malignant neoplasm of colon: Secondary | ICD-10-CM | POA: Diagnosis not present

## 2016-10-01 DIAGNOSIS — E7800 Pure hypercholesterolemia, unspecified: Secondary | ICD-10-CM

## 2016-10-01 DIAGNOSIS — Z1231 Encounter for screening mammogram for malignant neoplasm of breast: Secondary | ICD-10-CM

## 2016-10-01 DIAGNOSIS — Z1159 Encounter for screening for other viral diseases: Secondary | ICD-10-CM

## 2016-10-01 NOTE — Progress Notes (Signed)
Subjective:    Patient ID: Sophia Schmidt, female    DOB: 19-Nov-1950, 67 y.o.   MRN: [id number]  Sophia Schmidt is a 66 y.o. female presenting on 10/01/2016 for Follow-up (needs mamogram and colonoscopy ordered)   HPI   Breast Cancer Screening - Today here to request new order for routine mammogram screening. Reports her last mammogram done in 08/2014, she had left breast abnormality, had repeat diagnostic mammo and US showed likely benign cyst. In past did not have abnormal mammograms, has never had breast biopsy - Her sister was diagnosed with breast cancer in her late 70s - Currently asymptomatic, denies any breast pain, lump or bump, mass. She does regular self checks and has not had any problems  Colon Cancer Screening: - Today requests order for screening colonoscopy - No known family history of colon cancer, last colonoscopy age 69 approx about 10 years ago, results were negative without any polyps, was told good for 10 years, done locally at [facility], unsure name of physician or group that performed this  GERD - Reports chronic problem >3 years, has been treated with PPI Nexium 75m daily for past 3 years with significant improvement and control of symptoms, had typical heartburn symptoms and some concerns of feeling a lump in chest "food would get stuck" among other symptoms, now she had stopped taking for past 3 weeks due to concerns on news about "increased risk of cancer" - She has never had EGD before and not established with GI, but interested in a potential scope - Has not tried Zantac or other antacid medications - Denies any active abdominal pain, nausea, vomiting, early satiety, dark stools blood in stool  Additional Family History: - Has a family history of different types of cancer (mother pancreatic), brother (lung), sister (lung, breast cancer), sister (lung CA)  Social History  Substance Use Topics  . Smoking status: Never Smoker  . Smokeless tobacco: Never Used    . Alcohol use No    Review of Systems Per HPI unless specifically indicated above     Objective:    BP 114/76   Pulse 76   Temp 98.4 F (36.9 C) (Oral)   Resp 16   Ht '5\' 6"'  (1.676 m)   Wt 161 lb (73 kg)   BMI 25.99 kg/m   Wt Readings from Last 3 Encounters:  10/01/16 161 lb (73 kg)  04/01/16 157 lb (71.2 kg)  05/05/15 164 lb (74.4 kg)    Physical Exam  Constitutional: She is oriented to person, place, and time. She appears well-developed and well-nourished. No distress.  Well-appearing, comfortable, cooperative  HENT:  Head: Normocephalic and atraumatic.  Cardiovascular: Normal rate.   Pulmonary/Chest: Effort normal.  Abdominal: Soft. She exhibits no distension. There is no tenderness.  Neurological: She is alert and oriented to person, place, and time.  Skin: Skin is warm and dry. She is not diaphoretic.  Psychiatric: She has a normal mood and affect. Her behavior is normal.  Well groomed, good eye contact, normal speech and thoughts  Nursing note and vitals reviewed.    I have personally reviewed the radiology report from 09/14/14 Mammogram Screening.  CLINICAL DATA:  Screening.   EXAM:  DIGITAL SCREENING BILATERAL MAMMOGRAM WITH CAD   COMPARISON:  Previous exam(s).   ACR Breast Density Category b: There are scattered areas of  fibroglandular density.   FINDINGS:  In the left breast, possible masses warrant further evaluation. In  the right breast, no findings suspicious for  malignancy.   Images were processed with CAD.   IMPRESSION:  Further evaluation is suggested for possible masses in the left  breast.   RECOMMENDATION:  Diagnostic mammogram and possibly ultrasound of the left breast.  (Code:FI-L-84M)   The patient will be contacted regarding the findings, and additional  imaging will be scheduled.   BI-RADS CATEGORY  0: Incomplete. Need additional imaging evaluation  and/or prior mammograms for comparison.    Electronically  Signed    By: Curlene Dolphin M.D.    On: 09/14/2014 13:03   ----------------------------------------- 09/26/14  CLINICAL DATA:  Patient was called back from screening mammogram for a possible mass in the left breast.  EXAM: DIGITAL DIAGNOSTIC LEFT MAMMOGRAM WITH 3D TOMOSYNTHESIS WITH CAD  ULTRASOUND LEFT BREAST  COMPARISON:  With priors.  ACR Breast Density Category c: The breast tissue is heterogeneously dense, which may obscure small masses.  FINDINGS: Additional imaging of the left breast was obtained. There is persistence of a well-circumscribed 8 mm mass in the lower-inner quadrant of the breast. No additional masses are seen in the left breast. There are no malignant type microcalcifications.  Mammographic images were processed with CAD.  On physical exam, I do not palpate a mass in the left breast.  Targeted ultrasound is performed, showing a well-circumscribed near anechoic bilobed lesion measuring 7 x 4 x 5 mm. It is thought to be a simple cyst.  IMPRESSION: Probable benign cyst in the left breast.  RECOMMENDATION: Short-term interval followup left breast ultrasound in 6 months is recommended.  I have discussed the findings and recommendations with the patient. Results were also provided in writing at the conclusion of the visit. If applicable, a reminder letter will be sent to the patient regarding the next appointment.  BI-RADS CATEGORY  3: Probably benign.   Electronically Signed   By: Lillia Mountain M.D.   On: 09/26/2014 16:40   I have personally reviewed the following lab results from 04/01/16.  Results for orders placed or performed in visit on 04/01/16  COMPLETE METABOLIC PANEL WITH GFR  Result Value Ref Range   Sodium 143 135 - 146 mmol/L   Potassium 4.2 3.5 - 5.3 mmol/L   Chloride 108 98 - 110 mmol/L   CO2 27 20 - 31 mmol/L   Glucose, Bld 85 65 - 99 mg/dL   BUN 6 (L) 7 - 25 mg/dL   Creat 0.48 (L) 0.50 - 0.99 mg/dL   Total  Bilirubin 0.4 0.2 - 1.2 mg/dL   Alkaline Phosphatase 96 33 - 130 U/L   AST 11 10 - 35 U/L   ALT 10 6 - 29 U/L   Total Protein 6.9 6.1 - 8.1 g/dL   Albumin 4.3 3.6 - 5.1 g/dL   Calcium 10.6 (H) 8.6 - 10.4 mg/dL   GFR, Est African American >89 >=60 mL/min   GFR, Est Non African American >89 >=60 mL/min  CBC with Differential  Result Value Ref Range   WBC 5.8 3.8 - 10.8 K/uL   RBC 4.53 3.80 - 5.10 MIL/uL   Hemoglobin 11.9 11.7 - 15.5 g/dL   HCT 36.1 35.0 - 45.0 %   MCV 79.7 (L) 80.0 - 100.0 fL   MCH 26.3 (L) 27.0 - 33.0 pg   MCHC 33.0 32.0 - 36.0 g/dL   RDW 14.6 11.0 - 15.0 %   Platelets 306 140 - 400 K/uL   MPV 10.2 7.5 - 12.5 fL   Neutro Abs 2,726 1,500 - 7,800 cells/uL  Lymphs Abs 2,552 850 - 3,900 cells/uL   Monocytes Absolute 348 200 - 950 cells/uL   Eosinophils Absolute 116 15 - 500 cells/uL   Basophils Absolute 58 0 - 200 cells/uL   Neutrophils Relative % 47 %   Lymphocytes Relative 44 %   Monocytes Relative 6 %   Eosinophils Relative 2 %   Basophils Relative 1 %   Smear Review Criteria for review not met   Lipid Profile  Result Value Ref Range   Cholesterol 203 (H) 125 - 200 mg/dL   Triglycerides 84 <150 mg/dL   HDL 60 >=46 mg/dL   Total CHOL/HDL Ratio 3.4 <=5.0 Ratio   VLDL 17 <30 mg/dL   LDL Cholesterol 126 <130 mg/dL      Assessment & Plan:   Problem List Items Addressed This Visit    Screening for colon cancer    Due for routine colon cancer screening with colonoscopy, last done 10 years ago reported negative without polyps. - No known family history of colon CA  Plan: 1. Referral to [facility] Dr Vicente Males for colonoscopy - also to discuss GERD / possible EGD      Relevant Orders   Ambulatory referral to Gastroenterology   GERD (gastroesophageal reflux disease) - Primary    Suspected acute flare in setting chronic GERD with recent worsening due to stopped PPI x 3 weeks (concern over news of potential cancer risk) - No GI red flag symptoms. Exam is  unremarkable with benign abdomen without pain today - Chronic history of GERD, controlled on PPI daily, now refractory symptoms off PPI - History not suggestive of PUD  Plan: 1. Hold Nexium PPI for now 2. Switch to OTC Ranitidine Zantac 170m BID before meals try for 2-4 weeks 3. Diet modifications reduce GERD 4. Consider other OTC antacid PRN 5. Referral to  GI to follow-up GERD and discuss possible EGD at same time of upcoming routine colonoscopy - if pursues EGD and has normal result, may need to resume PPI and continue to monitor for any evidence of abnormality cautiously in future, discussed risks of chronic PPI use and the recent news articles are basing it off of researching linking H pylori PPI and stomach cancer but do not prove that it causes stomach cancer, advised it is a small risk that is unclear if the medication is actually increasing the risk, and she could take it in future again if needed to      Relevant Orders   Ambulatory referral to Gastroenterology   Breast cancer screening    Currently asymptomatic and due for routine mammogram breast cancer screening Last mammo 3-09/2014 with mild abnormality L breast found to be likely benign cyst on diagnostic mammo and [place]- No significant abnormal before - Known family history breast cancer sister age 65 Plan: 1. Ordered routine Mammogram, patient to call [facility] schedule when ready 2. Follow-up      Relevant Orders   MM DIGITAL SCREENING BILATERAL      No orders of the defined types were placed in this encounter.     Follow up plan: Return in about 6 months (around 04/02/2017) for Annual Physical.  Future labs ordered for upcoming Annual Physical  ANobie Putnam [facility] 10/01/2016, 10:08 PM

## 2016-10-01 NOTE — Assessment & Plan Note (Signed)
Due for routine colon cancer screening with colonoscopy, last done 10 years ago reported negative without polyps. - No known family history of colon CA  Plan: 1. Referral to [facility] Dr Tobi Bastos for colonoscopy - also to discuss GERD / possible EGD

## 2016-10-01 NOTE — Assessment & Plan Note (Signed)
Suspected acute flare in setting chronic GERD with recent worsening due to stopped PPI x 3 weeks (concern over news of potential cancer risk) - No GI red flag symptoms. Exam is unremarkable with benign abdomen without pain today - Chronic history of GERD, controlled on PPI daily, now refractory symptoms off PPI - History not suggestive of PUD  Plan: 1. Hold Nexium PPI for now 2. Switch to OTC Ranitidine Zantac  BID before meals try for 2-4 weeks 3. Diet modifications reduce GERD 4. Consider other OTC antacid PRN 5. Referral to [facility] to follow-up GERD and discuss possible EGD at same time of upcoming routine colonoscopy - if pursues EGD and has normal result, may need to resume PPI and continue to monitor for any evidence of abnormality cautiously in future, discussed risks of chronic PPI use and the recent news articles are basing it off of researching linking H pylori PPI and stomach cancer but do not prove that it causes stomach cancer, advised it is a small risk that is unclear if the medication is actually increasing the risk, and she could take it in future again if needed to

## 2016-10-01 NOTE — Assessment & Plan Note (Signed)
Currently asymptomatic and due for routine mammogram breast cancer screening Last mammo 3-09/2014 with mild abnormality L breast found to be likely benign cyst on diagnostic mammo and [place] - No significant abnormal before - Known family history breast cancer sister age 65  Plan: 1. Ordered routine Mammogram, patient to call [facility] to schedule when ready 2. Follow-up

## 2016-10-01 NOTE — Patient Instructions (Signed)
Thank you for coming in to clinic today.  1.  - Stop Nexium - Start Zantac (Ranitidine generic)  twice daily before meal for up to 2 to 4 weeks, if helping can continue to take longer, otherwise may only need for 2-4 weeks at a time - Avoid spicy, greasy, fried foods, also things like caffeine, dark chocolate, peppermint can worsen - Avoid large meals and late night snacks, also do not go more than 4-5 hours without a snack or meal (not eating will worsen reflux symptoms due to stomach acid)  If symptoms are worsening, persistent symptoms despite treatment or develop esophageal or abdominal pain, unable to swallow solids or liquids, nausea, vomiting, fever/chills, or unintentional weight loss / no appetite, please follow-up sooner or seek more immediate medical attention.  Referral placed to GI specialist - for discussion of Acid Reflux (GERD) and possible Upper Endoscopy, also request routine screening Colonoscopy - call them if you don't hear back within 2 weeks with apt  [facility] (GI) [address] - [address] [place], [place] [address] Phone: [phone]  Wyline Mood, MD   ---------------------------------------------  [facility] [address] [place], [place] [address] Phone: [phone] Call anytime to schedule now that order has been placed.  You will be due for \\FASTING  BLOOD WORK \(no food or drink after midnight before, only water or coffee without cream/sugar on the morning of)  - Please go ahead and schedule a "Lab Only" visit in the morning at the clinic for lab draw in 6 months after 04/01/17 before next Annual Physical - Make sure Lab Only appointment is at least 1-2 weeks before your next appointment, so that results will be available  Please schedule a follow-up appointment with Dr. Althea Charon in 6 months for Annual PHysical  If you have any other questions or concerns, please feel  free to call the clinic or send a message through MyChart. You may also schedule an earlier appointment if necessary.  Saralyn Pilar, DO [facility], [place]

## 2016-10-02 ENCOUNTER — Ambulatory Visit: Payer: Self-pay | Admitting: Family Medicine

## 2016-11-05 ENCOUNTER — Encounter: Payer: Self-pay | Admitting: Gastroenterology

## 2016-11-05 ENCOUNTER — Ambulatory Visit (INDEPENDENT_AMBULATORY_CARE_PROVIDER_SITE_OTHER): Payer: Self-pay | Admitting: Gastroenterology

## 2016-11-05 ENCOUNTER — Telehealth: Payer: Self-pay

## 2016-11-05 VITALS — BP 129/85 | HR 73 | Temp 98.2°F | Ht 66.0 in | Wt 162.2 lb

## 2016-11-05 DIAGNOSIS — Z1211 Encounter for screening for malignant neoplasm of colon: Secondary | ICD-10-CM

## 2016-11-05 DIAGNOSIS — Z1212 Encounter for screening for malignant neoplasm of rectum: Secondary | ICD-10-CM

## 2016-11-05 DIAGNOSIS — K219 Gastro-esophageal reflux disease without esophagitis: Secondary | ICD-10-CM

## 2016-11-05 DIAGNOSIS — R1310 Dysphagia, unspecified: Secondary | ICD-10-CM

## 2016-11-05 MED ORDER — RANITIDINE HCL 150 MG PO TABS: 150.0000 mg | ORAL_TABLET | Freq: Two times a day (BID) | ORAL | 2 refills | Status: DC

## 2016-11-05 NOTE — Telephone Encounter (Signed)
Gastroenterology Pre-Procedure Review  Request Date: 6/15 Requesting Physician: Dr. Tobi BastosAnna  PATIENT REVIEW QUESTIONS: The patient responded to the following health history questions as indicated:    1. Are you having any GI issues? no 2. Do you have a personal history of Polyps? no 3. Do you have a family history of Colon Cancer or Polyps? no 4. Diabetes Mellitus? no 5. Joint replacements in the past 12 months?no 6. Major health problems in the past 3 months?no 7. Any artificial heart valves, MVP, or defibrillator?no    MEDICATIONS & ALLERGIES:    Patient reports the following regarding taking any anticoagulation/antiplatelet therapy:   Plavix, Coumadin, Eliquis, Xarelto, Lovenox, Pradaxa, Brilinta, or Effient? no Aspirin? no  Patient confirms/reports the following medications:  Current Outpatient Prescriptions  Medication Sig Dispense Refill  . aspirin EC 81 MG tablet Take 81 mg by mouth daily.    . cyclobenzaprine (FLEXERIL) 10 MG tablet cyclobenzaprine 10 mg tablet  1 POQ 8 hrs prn muscle tightness    . meloxicam (MOBIC) 15 MG tablet meloxicam 15 mg tablet  Take 1 tablet every day by oral route with meals.     No current facility-administered medications for this visit.     Patient confirms/reports the following allergies:  No Known Allergies  No orders of the defined types were placed in this encounter.   AUTHORIZATION INFORMATION Primary Insurance: 1D#: Group #:  Secondary Insurance: 1D#: Group #:  SCHEDULE INFORMATION: Date: 6/15 Time: Location: [facility]

## 2016-11-05 NOTE — Progress Notes (Signed)
Gastroenterology Consultation  Referring Provider:     Saralyn Pilar * Primary Care Physician:  Smitty Cords, DO Primary Gastroenterologist:  Dr. Wyline Mood  Reason for Consultation:     GERD,CRCS        HPI:   Sophia Schmidt is a 66 y.o. y/o female referred for consultation & management  by Dr. Althea Charon, Netta Neat, DO.    She has been referred for A colonoscopy for CRCS and possible EGD for loing standing GERD and occasional dysphagia.   For GERD/dyshagia and colorectal cancer screening   Reflux:  Onset : 5 years atleast  Symptoms: Feels the food sits in her chest , gets stuck , mostly solids, harder for meat to go down  Recent weight gain: 3 lbs  Medications: no  Narcotics or anticholinergics use : no  PPI /H2 blockers or Antacid  use and timing :Has tried Nexium for many years, presently she is on Prevacid once daily over the counter - dose unknown, no symptoms presently of reflux but food still gets stuck . On nexium she had no issues with swallowing  Dinner time : 4-5 pm , goes to bed around 8-9 pm , In between sitting and watching TV   Prior EGD: no  Family history of esophageal cancer:no  Never smoked, no alcohol     Last colonoscopy was 10 years , no polyps, no family history of colon polyps, or colon cancer. No GI issues such as change in bowel habits or rectal bleeding.    Past Medical History:  Diagnosis Date  . Anemia   . GERD (gastroesophageal reflux disease)     Past Surgical History:  Procedure Laterality Date  . ABDOMINAL HYSTERECTOMY  1998   partial  . BREAST SURGERY Left 1998   removal of 2 fibrocystic masses  . COLONOSCOPY  2011  . TONSILLECTOMY  1996    Prior to Admission medications   Not on File    Family History  Problem Relation Age of Onset  . Cancer Mother        pancreatic  . Alzheimer's disease Father   . Cancer Sister        lung  . Cancer Brother        lung  . Breast cancer Sister        83     Social History  Substance Use Topics  . Smoking status: Never Smoker  . Smokeless tobacco: Never Used  . Alcohol use No    Allergies as of 11/05/2016  . (Not on File)    Review of Systems:    All systems reviewed and negative except where noted in HPI.   Physical Exam:  There were no vitals taken for this visit. No LMP recorded. Patient has had a hysterectomy. Psych:  Alert and cooperative. Normal mood and affect. General:   Alert,  Well-developed, well-nourished, pleasant and cooperative in NAD Head:  Normocephalic and atraumatic. Eyes:  Sclera clear, no icterus.   Conjunctiva pink. Ears:  Normal auditory acuity. Nose:  No deformity, discharge, or lesions. Mouth:  No deformity or lesions,oropharynx pink & moist. Neck:  Supple; no masses or thyromegaly. Lungs:  Respirations even and unlabored.  Clear throughout to auscultation.   No wheezes, crackles, or rhonchi. No acute distress. Heart:  Regular rate and rhythm; no murmurs, clicks, rubs, or gallops. Abdomen:  Normal bowel sounds.  No bruits.  Soft, non-tender and non-distended without masses, hepatosplenomegaly or hernias noted.  No guarding or rebound  tenderness.    Msk:  Symmetrical without gross deformities. Good, equal movement & strength bilaterally. Pulses:  Normal pulses noted. Extremities:  No clubbing or edema.  No cyanosis. Neurologic:  Alert and oriented x3;  grossly normal neurologically. Skin:  Intact without significant lesions or rashes. No jaundice. Lymph Nodes:  No significant cervical adenopathy. Psych:  Alert and cooperative. Normal mood and affect.  Imaging Studies: No results found.  Assessment and Plan:   Shon HoughBarbara J Pillars is a 66 y.o. y/o female has been referred for long standing GERD, some dysphagia and need for a colonoscopy for CRCS   Plan  1. EGD+colonoscopy for dysphagia/gerd and colorectal cancer screening (average risk)  2. I do not a mild microcytic anemia in 03/2016 and she has  further lab work ordered and if there is a concern for iron deficiency anemia- a capsule study of the small bowel may be warranted if her EGD and colonoscopy are negative  3.GERD  Counseled on life style changes, suggest to use PPI first thing in the morning on empty stomach and eat 30 minutes after. Advised on the use of a wedge pillow at night , avoid meals for 2 hours prior to bed time. Weight loss  Discussed the risks and benefits of long term PPI use including but not limited to bone loss, chronic kidney disease, infections , low magnesium . Aim to use at the lowest dose for the shortest period of time   I will stop her prevacid and see if we can transition her to Zantac BID if possible.   I have discussed alternative options, risks & benefits,  which include, but are not limited to, bleeding, infection, perforation,respiratory complication & drug reaction.  The patient agrees with this plan & written consent will be obtained.      F/u in 3 months   Dr Wyline MoodKiran Myrta Mercer MD

## 2016-11-13 ENCOUNTER — Telehealth: Payer: Self-pay | Admitting: Gastroenterology

## 2016-11-13 NOTE — Telephone Encounter (Signed)
Patient left a voice message that due to a family issue she needs to reschedule her colonoscopy the week before or the week after. Please call

## 2016-11-13 NOTE — Telephone Encounter (Signed)
Pt requesting reschedule of procedures due to family emergency.   Moving to June 29th

## 2016-11-20 ENCOUNTER — Telehealth: Payer: Self-pay | Admitting: Gastroenterology

## 2016-11-20 NOTE — Telephone Encounter (Signed)
11/20/16 Received fax for Auth # [id number] for EGD & Colonoscopy.

## 2016-12-12 ENCOUNTER — Encounter: Payer: Self-pay | Admitting: *Deleted

## 2016-12-13 ENCOUNTER — Encounter
Admission: RE | Disposition: A | Discharge status: Home / Self Care | Payer: Self-pay | Source: Ambulatory Visit | Attending: Gastroenterology

## 2016-12-13 ENCOUNTER — Ambulatory Visit
Admission: RE | Admit: 2016-12-13 | Discharge: 2016-12-13 | Disposition: A | Discharge status: Home / Self Care | Payer: No Typology Code available for payment source | Source: Ambulatory Visit | Attending: Gastroenterology | Admitting: Gastroenterology

## 2016-12-13 ENCOUNTER — Ambulatory Visit: Payer: No Typology Code available for payment source | Admitting: Anesthesiology

## 2016-12-13 DIAGNOSIS — K640 First degree hemorrhoids: Secondary | ICD-10-CM | POA: Diagnosis not present

## 2016-12-13 DIAGNOSIS — Z7982 Long term (current) use of aspirin: Secondary | ICD-10-CM | POA: Insufficient documentation

## 2016-12-13 DIAGNOSIS — Z86010 Personal history of colonic polyps: Secondary | ICD-10-CM | POA: Diagnosis not present

## 2016-12-13 DIAGNOSIS — Z803 Family history of malignant neoplasm of breast: Secondary | ICD-10-CM | POA: Diagnosis not present

## 2016-12-13 DIAGNOSIS — Z90710 Acquired absence of both cervix and uterus: Secondary | ICD-10-CM | POA: Insufficient documentation

## 2016-12-13 DIAGNOSIS — K219 Gastro-esophageal reflux disease without esophagitis: Secondary | ICD-10-CM | POA: Diagnosis not present

## 2016-12-13 DIAGNOSIS — Z1211 Encounter for screening for malignant neoplasm of colon: Secondary | ICD-10-CM | POA: Diagnosis not present

## 2016-12-13 DIAGNOSIS — Z1212 Encounter for screening for malignant neoplasm of rectum: Secondary | ICD-10-CM

## 2016-12-13 DIAGNOSIS — Z801 Family history of malignant neoplasm of trachea, bronchus and lung: Secondary | ICD-10-CM | POA: Insufficient documentation

## 2016-12-13 DIAGNOSIS — R1310 Dysphagia, unspecified: Secondary | ICD-10-CM

## 2016-12-13 DIAGNOSIS — Z800 Family history of malignant neoplasm of digestive organs: Secondary | ICD-10-CM | POA: Insufficient documentation

## 2016-12-13 DIAGNOSIS — K5730 Diverticulosis of large intestine without perforation or abscess without bleeding: Secondary | ICD-10-CM | POA: Diagnosis not present

## 2016-12-13 DIAGNOSIS — K449 Diaphragmatic hernia without obstruction or gangrene: Secondary | ICD-10-CM | POA: Diagnosis not present

## 2016-12-13 HISTORY — PX: ESOPHAGOGASTRODUODENOSCOPY (EGD) WITH PROPOFOL: SHX5813

## 2016-12-13 HISTORY — PX: COLONOSCOPY WITH PROPOFOL: SHX5780

## 2016-12-13 SURGERY — ESOPHAGOGASTRODUODENOSCOPY (EGD) WITH PROPOFOL
Anesthesia: General

## 2016-12-13 MED ORDER — GLYCOPYRROLATE 0.2 MG/ML IJ SOLN
INTRAMUSCULAR | Status: DC | PRN
  Administered 2016-12-13: 0.2 mg via INTRAVENOUS

## 2016-12-13 MED ORDER — PROPOFOL 10 MG/ML IV BOLUS
INTRAVENOUS | Status: DC | PRN
  Administered 2016-12-13: 70 mg via INTRAVENOUS

## 2016-12-13 MED ORDER — LIDOCAINE HCL (PF) 2 % IJ SOLN
INTRAMUSCULAR | Status: AC
  Filled 2016-12-13: qty 2

## 2016-12-13 MED ORDER — GLYCOPYRROLATE 0.2 MG/ML IJ SOLN
INTRAMUSCULAR | Status: AC
  Filled 2016-12-13: qty 1

## 2016-12-13 MED ORDER — PROPOFOL 500 MG/50ML IV EMUL
INTRAVENOUS | Status: AC
Start: 2016-12-13 — End: 2016-12-13
  Filled 2016-12-13: qty 50

## 2016-12-13 MED ORDER — SODIUM CHLORIDE 0.9 % IV SOLN
INTRAVENOUS | Status: DC
  Administered 2016-12-13: 10:00:00 via INTRAVENOUS

## 2016-12-13 MED ORDER — LIDOCAINE 2% (20 MG/ML) 5 ML SYRINGE
INTRAMUSCULAR | Status: DC | PRN
  Administered 2016-12-13: 100 mg via INTRAVENOUS

## 2016-12-13 MED ORDER — PROPOFOL 500 MG/50ML IV EMUL
INTRAVENOUS | Status: DC | PRN
  Administered 2016-12-13: 200 ug/kg/min via INTRAVENOUS

## 2016-12-13 NOTE — Anesthesia Preprocedure Evaluation (Addendum)
Anesthesia Evaluation  Patient identified by MRN, date of birth, ID band Patient awake    Reviewed: Allergy & Precautions, NPO status , Patient's Chart, lab work & pertinent test results, reviewed documented beta blocker date and time   Airway Mallampati: II  TM Distance: >3 FB     Dental  (+) Upper Dentures, Lower Dentures   Pulmonary           Cardiovascular      Neuro/Psych  Neuromuscular disease    GI/Hepatic   Endo/Other    Renal/GU      Musculoskeletal   Abdominal   Peds  Hematology  (+) anemia ,   Anesthesia Other Findings   Reproductive/Obstetrics                            Anesthesia Physical Anesthesia Plan  ASA: II  Anesthesia Plan: General   Post-op Pain Management:    Induction: Intravenous  PONV Risk Score and Plan:   Airway Management Planned:   Additional Equipment:   Intra-op Plan:   Post-operative Plan:   Informed Consent: I have reviewed the patients History and Physical, chart, labs and discussed the procedure including the risks, benefits and alternatives for the proposed anesthesia with the patient or authorized representative who has indicated his/her understanding and acceptance.     Plan Discussed with: CRNA  Anesthesia Plan Comments:         Anesthesia Quick Evaluation

## 2016-12-13 NOTE — Op Note (Signed)
[facility] Gastroenterology Patient Name: Sophia MerlBarbara Fugere Procedure Date: 12/13/2016 9:49 AM MRN: [id number] Account #: [id number] Date of Birth: 06/04/1951 Admit Type: Outpatient Age: [age] Room: [facility] END[facility] ROOM 4 Gender: Female Note Status: Finalized Procedure:            Upper GI endoscopy Indications:          Dysphagia Providers:            Wyline MoodKiran Jamorian Dimaria MD, MD Referring MD:         Smitty CordsAlexander J. Karamalegos (Referring MD) Medicines:            Monitored Anesthesia Care Complications:        No immediate complications. Procedure:            Pre-Anesthesia Assessment:                       - Prior to the procedure, a History and Physical was                        performed, and patient medications, allergies and                        sensitivities were reviewed. The patient's tolerance of                        previous anesthesia was reviewed.                       - The risks and benefits of the procedure and the                        sedation options and risks were discussed with the                        patient. All questions were answered and informed                        consent was obtained.                       - ASA Grade Assessment: II - A patient with mild                        systemic disease.                       After obtaining informed consent, the endoscope was                        passed under direct vision. Throughout the procedure,                        the patient's blood pressure, pulse, and oxygen                        saturations were monitored continuously. The Endoscope                        was introduced through the mouth, and advanced to the  third part of duodenum. The upper GI endoscopy was                        accomplished with ease. The patient tolerated the                        procedure well. Findings:      The examined duodenum was normal.      A 4 cm hiatal hernia was present.      No  endoscopic abnormality was evident in the esophagus to explain the       patient's complaint of dysphagia. Impression:           - Normal examined duodenum.                       - 4 cm hiatal hernia.                       - No endoscopic esophageal abnormality to explain                        patient's dysphagia.                       - No specimens collected. Recommendation:       - Discharge patient to home (with escort).                       - 1. She has dysphagia to solids and liquids likely a                        motiluity issue.                       2. Continue PPI for GERD                       3. If symptoms persist will need esophageal manometry Procedure Code(s):    --- Professional ---                       [phone], Esophagogastroduodenoscopy, flexible, transoral;                        diagnostic, including collection of specimen(s) by                        brushing or washing, when performed (separate procedure) Diagnosis Code(s):    --- Professional ---                       K44.9, Diaphragmatic hernia without obstruction or                        gangrene                       R13.10, Dysphagia, unspecified CPT copyright 2016 American Medical Association. All rights reserved. The codes documented in this report are preliminary and upon coder review may  be revised to meet current compliance requirements. Wyline Mood, MD Wyline Mood MD, MD 12/13/2016 10:07:15 AM This report has been signed electronically. Number of Addenda: 0 Note Initiated On: 12/13/2016 9:49 AM  [facility]

## 2016-12-13 NOTE — H&P (Signed)
  Sophia MoodKiran Latarra Eagleton MD [address] [place], [place] [address] Phone: [phone] Fax : [phone]  Primary Care Physician:  Smitty CordsKaramalegos, Alexander J, DO Primary Gastroenterologist:  Dr. Wyline MoodKiran Iram Schmidt   Pre-Procedure History & Physical: HPI:  Sophia Schmidt is a 66 y.o. female is here for an endoscopy and colonoscopy.   Past Medical History:  Diagnosis Date  . Anemia   . GERD (gastroesophageal reflux disease)     Past Surgical History:  Procedure Laterality Date  . ABDOMINAL HYSTERECTOMY  1998   partial  . BREAST SURGERY Left 1998   removal of 2 fibrocystic masses  . COLONOSCOPY  2011  . TONSILLECTOMY  1996    Prior to Admission medications   Medication Sig Start Date End Date Taking? Authorizing Provider  aspirin EC 81 MG tablet Take 81 mg by mouth daily.    [provider]  cyclobenzaprine (FLEXERIL) 10 MG tablet cyclobenzaprine 10 mg tablet  1 POQ 8 hrs prn muscle tightness    [provider]  meloxicam (MOBIC) 15 MG tablet meloxicam 15 mg tablet  Take 1 tablet every day by oral route with meals.    [provider]  ranitidine (ZANTAC) 150 MG tablet Take 1 tablet (150 mg total) by mouth 2 (two) times daily. 11/05/16 01/05/17  Sophia MoodAnna, Sophia Wroe, MD    Allergies as of 11/05/2016  . (No Known Allergies)    Family History  Problem Relation Age of Onset  . Cancer Mother        pancreatic  . Alzheimer's disease Father   . Cancer Sister        lung  . Cancer Brother        lung  . Breast cancer Sister        [id number]    Social History   Social History  . Marital status: Divorced    Spouse name: N/A  . Number of children: N/A  . Years of education: N/A   Occupational History  . Not on file.   Social History Main Topics  . Smoking status: Never Smoker  . Smokeless tobacco: Never Used  . Alcohol use No  . Drug use: No  . Sexual activity: Yes   Other Topics Concern  . Not on file   Social History Narrative  . No narrative on file      Review of Systems: See HPI, otherwise negative ROS  Physical Exam: There were no vitals taken for this visit. General:   Alert,  pleasant and cooperative in NAD Head:  Normocephalic and atraumatic. Neck:  Supple; no masses or thyromegaly. Lungs:  Clear throughout to auscultation.    Heart:  Regular rate and rhythm. Abdomen:  Soft, nontender and nondistended. Normal bowel sounds, without guarding, and without rebound.   Neurologic:  Alert and  oriented x4;  grossly normal neurologically.  Impression/Plan: Sophia Schmidt is here for an endoscopy and colonoscopy to be performed for dysphagia and colorectal cancer screening   Risks, benefits, limitations, and alternatives regarding  endoscopy, colonoscopy and possible dilation  have been reviewed with the patient.  Questions have been answered.  All parties agreeable.   Sophia MoodKiran Danielle Lento, MD  12/13/2016, 9:50 AM

## 2016-12-13 NOTE — Transfer of Care (Signed)
Immediate Anesthesia Transfer of Care Note  Patient: Shon HoughBarbara J Batdorf  Procedure(s) Performed: Procedure(s): ESOPHAGOGASTRODUODENOSCOPY (EGD) WITH PROPOFOL (N/A) COLONOSCOPY WITH PROPOFOL (N/A)  Patient Location: Endoscopy Unit  Anesthesia Type:General  Level of Consciousness: awake  Airway & Oxygen Therapy: Patient connected to nasal cannula oxygen  Post-op Assessment: Post -op Vital signs reviewed and stable  Post vital signs: stable  Last Vitals:  Vitals:   12/13/16 1028  BP: 105/73  Resp: (!) 27  Temp: (!) 35.7 C    Last Pain:  Vitals:   12/13/16 1028  TempSrc: Tympanic         Complications: No apparent anesthesia complications

## 2016-12-13 NOTE — Anesthesia Postprocedure Evaluation (Signed)
Anesthesia Post Note  Patient: Shon HoughBarbara J Fritzsche  Procedure(s) Performed: Procedure(s) (LRB): ESOPHAGOGASTRODUODENOSCOPY (EGD) WITH PROPOFOL (N/A) COLONOSCOPY WITH PROPOFOL (N/A)  Patient location during evaluation: Endoscopy Anesthesia Type: General Level of consciousness: awake and alert Pain management: pain level controlled Vital Signs Assessment: post-procedure vital signs reviewed and stable Respiratory status: spontaneous breathing, nonlabored ventilation, respiratory function stable and patient connected to nasal cannula oxygen Cardiovascular status: blood pressure returned to baseline and stable Postop Assessment: no signs of nausea or vomiting Anesthetic complications: no     Last Vitals:  Vitals:   12/13/16 1038 12/13/16 1058  BP: (!) 117/94 118/73  Pulse:  69  Resp: (!) 21 20  Temp:      Last Pain:  Vitals:   12/13/16 1028  TempSrc: Tympanic                 Ankush Gintz S

## 2016-12-13 NOTE — Anesthesia Post-op Follow-up Note (Cosign Needed)
Anesthesia QCDR form completed.        

## 2016-12-13 NOTE — Op Note (Signed)
[facility] Gastroenterology Patient Name: Sophia MerlBarbara Berkowitz Procedure Date: 12/13/2016 9:44 AM MRN: [id number] Account #: [id number] Date of Birth: 11-13-1950 Admit Type: Outpatient Age: [age] [facility] 4 Gender: Female Note Status: Finalized Procedure:            Colonoscopy Indications:          High risk colon cancer surveillance: Personal history                        of colonic polyps Providers:            Wyline MoodKiran Claxton Levitz MD, MD Medicines:            Monitored Anesthesia Care Complications:        No immediate complications. Procedure:            Pre-Anesthesia Assessment:                       - Prior to the procedure, a History and Physical was                        performed, and patient medications, allergies and                        sensitivities were reviewed. The patient's tolerance of                        previous anesthesia was reviewed.                       - The risks and benefits of the procedure and the                        sedation options and risks were discussed with the                        patient. All questions were answered and informed                        consent was obtained.                       - ASA Grade Assessment: II - A patient with mild                        systemic disease.                       After obtaining informed consent, the colonoscope was                        passed under direct vision. Throughout the procedure,                        the patient's blood pressure, pulse, and oxygen                        saturations were monitored continuously. The                        Colonoscope was introduced through the anus and  advanced to the the cecum, identified by the                        appendiceal orifice, IC valve and transillumination.                        The colonoscopy was performed with ease. The patient                        tolerated the procedure well. The quality  of the bowel                        preparation was good. Findings:      The perianal and digital rectal examinations were normal.      A few small-mouthed diverticula were found in the sigmoid colon.      Non-bleeding internal hemorrhoids were found during retroflexion. The       hemorrhoids were large and Grade I (internal hemorrhoids that do not       prolapse).      The exam was otherwise without abnormality on direct and retroflexion       views. Impression:           - Diverticulosis in the sigmoid colon.                       - Non-bleeding internal hemorrhoids.                       - The examination was otherwise normal on direct and                        retroflexion views.                       - No specimens collected. Recommendation:       - Discharge patient to home (with escort).                       - Resume previous diet.                       - Continue present medications.                       - Repeat colonoscopy in 5 years for surveillance.                       - Return to my office in 6 weeks. Procedure Code(s):    --- Professional ---                       Z6109, Colorectal cancer screening; colonoscopy on                        individual at high risk Diagnosis Code(s):    --- Professional ---                       Z86.010, Personal history of colonic polyps                       K64.0, First degree hemorrhoids  K57.30, Diverticulosis of large intestine without                        perforation or abscess without bleeding CPT copyright 2016 American Medical Association. All rights reserved. The codes documented in this report are preliminary and upon coder review may  be revised to meet current compliance requirements. Wyline Mood, MD Wyline Mood MD, MD 12/13/2016 10:23:44 AM This report has been signed electronically. Number of Addenda: 0 Note Initiated On: 12/13/2016 9:44 AM Scope Withdrawal Time: 0 hours 10 minutes 33 seconds   Total Procedure Duration: 0 hours 13 minutes 15 seconds       [facility]

## 2016-12-16 ENCOUNTER — Encounter: Payer: Self-pay | Admitting: Gastroenterology

## 2017-02-11 ENCOUNTER — Encounter (INDEPENDENT_AMBULATORY_CARE_PROVIDER_SITE_OTHER): Payer: Self-pay

## 2017-02-11 ENCOUNTER — Encounter: Payer: Self-pay | Admitting: Gastroenterology

## 2017-02-11 ENCOUNTER — Ambulatory Visit (INDEPENDENT_AMBULATORY_CARE_PROVIDER_SITE_OTHER): Payer: No Typology Code available for payment source | Admitting: Gastroenterology

## 2017-02-11 VITALS — BP 126/85 | HR 73 | Ht 66.0 in | Wt 160.6 lb

## 2017-02-11 DIAGNOSIS — K219 Gastro-esophageal reflux disease without esophagitis: Secondary | ICD-10-CM

## 2017-02-11 NOTE — Progress Notes (Signed)
   Wyline Mood MD, MRCP(U.K) [address]  [address]  [place], [place] [address]  Main: [phone]  Fax: [phone]   Primary Care Physician: Smitty Cords, DO  Primary Gastroenterologist:  Dr. Wyline Mood   No chief complaint on file.   HPI: KINSEY SCULL is a 66 y.o. female    Summary of history :  Seen at the initial visit on 11/05/16 for GERD of 5 years duration , some dysphagia. I had ordred a CBC at last visit as there was some evidence of microcytic anemia in the past - she didn't obtain the tests.   Interval history   12/13/2016-  02/11/2017   12/13/16- EGD- 4 cm hiatal hernia- no strictures. Colonoscopy Dysphagia resolved. On zantac 150 mg once a day .  She suffers from sickle cell trait and has always been a bit anemic since her childhood.   Current Outpatient Prescriptions  Medication Sig Dispense Refill  . aspirin EC 81 MG tablet Take 81 mg by mouth daily.    . cyclobenzaprine (FLEXERIL) 10 MG tablet cyclobenzaprine 10 mg tablet  1 POQ 8 hrs prn muscle tightness    . meloxicam (MOBIC) 15 MG tablet meloxicam 15 mg tablet  Take 1 tablet every day by oral route with meals.    . ranitidine (ZANTAC) 150 MG tablet Take 1 tablet (150 mg total) by mouth 2 (two) times daily. 60 tablet 2   No current facility-administered medications for this visit.     Allergies as of 02/11/2017  . (No Known Allergies)    ROS:  General: Negative for anorexia, weight loss, fever, chills, fatigue, weakness. ENT: Negative for hoarseness, difficulty swallowing , nasal congestion. CV: Negative for chest pain, angina, palpitations, dyspnea on exertion, peripheral edema.  Respiratory: Negative for dyspnea at rest, dyspnea on exertion, cough, sputum, wheezing.  GI: See history of present illness. GU:  Negative for dysuria, hematuria, urinary incontinence, urinary frequency, nocturnal urination.  Endo: Negative for unusual weight change.    Physical Examination:   There were no vitals taken for this visit.  General: Well-nourished, well-developed in no acute distress.  Eyes: No icterus. Conjunctivae pink. Mouth: Oropharyngeal mucosa moist and pink , no lesions erythema or exudate. Lungs: Clear to auscultation bilaterally. Non-labored. Heart: Regular rate and rhythm, no murmurs rubs or gallops.  Abdomen: Bowel sounds are normal, nontender, nondistended, no hepatosplenomegaly or masses, no abdominal bruits or hernia , no rebound or guarding.   Extremities: No lower extremity edema. No clubbing or deformities. Neuro: Alert and oriented x 3.  Grossly intact. Skin: Warm and dry, no jaundice.   Psych: Alert and cooperative, normal mood and affect.   Imaging Studies: No results found.  Assessment and Plan:   NELA GANDER is a 66 y.o. y/o female here to follow up for GERD.  Doing well on Zantac once a day . Advised to continue lifestyle changes .Likely long standing microcytic mild anemia from sickle cell trait.   Dr Wyline Mood  MD,MRCP [facility]) Follow up as needed

## 2017-04-02 ENCOUNTER — Encounter: Payer: Self-pay | Admitting: Family Medicine

## 2017-04-02 ENCOUNTER — Ambulatory Visit (INDEPENDENT_AMBULATORY_CARE_PROVIDER_SITE_OTHER): Payer: 59 | Admitting: Family Medicine

## 2017-04-02 ENCOUNTER — Encounter: Payer: 59 | Admitting: Family Medicine

## 2017-04-02 VITALS — BP 111/81 | HR 74 | Temp 98.3°F | Resp 16 | Ht 66.0 in | Wt 158.0 lb

## 2017-04-02 DIAGNOSIS — R718 Other abnormality of red blood cells: Secondary | ICD-10-CM | POA: Diagnosis not present

## 2017-04-02 DIAGNOSIS — Z114 Encounter for screening for human immunodeficiency virus [HIV]: Secondary | ICD-10-CM

## 2017-04-02 DIAGNOSIS — Z1231 Encounter for screening mammogram for malignant neoplasm of breast: Secondary | ICD-10-CM | POA: Diagnosis not present

## 2017-04-02 DIAGNOSIS — Z0000 Encounter for general adult medical examination without abnormal findings: Secondary | ICD-10-CM

## 2017-04-02 DIAGNOSIS — K219 Gastro-esophageal reflux disease without esophagitis: Secondary | ICD-10-CM | POA: Diagnosis not present

## 2017-04-02 DIAGNOSIS — Z1159 Encounter for screening for other viral diseases: Secondary | ICD-10-CM

## 2017-04-02 DIAGNOSIS — E7800 Pure hypercholesterolemia, unspecified: Secondary | ICD-10-CM

## 2017-04-02 DIAGNOSIS — Z1239 Encounter for other screening for malignant neoplasm of breast: Secondary | ICD-10-CM

## 2017-04-02 DIAGNOSIS — R799 Abnormal finding of blood chemistry, unspecified: Secondary | ICD-10-CM | POA: Diagnosis not present

## 2017-04-02 DIAGNOSIS — Z780 Asymptomatic menopausal state: Secondary | ICD-10-CM | POA: Diagnosis not present

## 2017-04-02 DIAGNOSIS — Z87898 Personal history of other specified conditions: Secondary | ICD-10-CM

## 2017-04-02 MED ORDER — RANITIDINE HCL 150 MG PO TABS: 150.0000 mg | ORAL_TABLET | Freq: Every day | ORAL | Status: DC

## 2017-04-02 NOTE — Patient Instructions (Addendum)
Thank you for coming to the clinic today.  1. Keep up the great work!  For Mammogram screening for breast cancer / DEXA Scan (Bone mineral density) screening for osteoporosis  Call the Imaging Center below anytime to schedule your own appointment now that order has been placed.  [facility] [address] [place], [place] [address] Phone: [phone]  If you need a supplement for bone health - consider Calcium 1200 mg daily and Vitamin D3 800 to 1,000 iu daily  3. We will call you with lab results after about 2-3 days, to give you the biometric numbers   DUE for FASTING BLOOD WORK (no food or drink after midnight before the lab appointment, only water or coffee without cream/sugar on the morning of)  SCHEDULE "Lab Only" visit in the morning at the clinic for lab draw within 1 week  Please schedule a Follow-up Appointment to: Return in about 1 year (around 04/02/2018) for Annual Physical.  If you have any other questions or concerns, please feel free to call the clinic or send a message through MyChart. You may also schedule an earlier appointment if necessary.  Additionally, you may be receiving a survey about your experience at our clinic within a few days to 1 week by e-mail or mail. We value your feedback.  Saralyn PilarAlexander Jenella Craigie, DO [facility]

## 2017-04-02 NOTE — Progress Notes (Addendum)
Subjective:    Patient ID: Sophia Schmidt, female    DOB: [birth year], 66 y.o.   MRN: [id number]  Sophia Schmidt is a 66 y.o. female presenting on 04/02/2017 for Annual Exam   HPI   Here for Annual Physical. No labs obtained before visit, will return for labs.  She has no new complaints today. Interested in various screening tests at this time that did not complete since last visit.  Will need biometric screening lab results for her work before 04/15/17.  HYPERLIPIDEMIA: - Reports no concerns. Last lipid panel 03/2016, mostly controlled  - Not taking a Statin or cholesterol medication - She is taking ASA 29m daily Lifestyle - Weight down 4-5 lbs in 5 months - Diet: improved healthier diet - Exercise: walking  FOLLOW-UP GERD / Dysphagia vs Esophageal Motility - Last visit with me 10/01/16, for initial visit for same problem, treated with switch from PPI to Zantac 150 BID, [facility] see prior notes for background information. - Interval update with has seen AGI in 10/2016 and 11/2016, both procedures done, see report, small hiatal hernia, no evidence to explain dysphagia, she was continued on Zantac 1550mDAILY - Today patient reports doing well, no new complaints at this time  Health Maintenance: - Due for Flu Shot, declines today despite counseling on benefits - Breast CA Screening: Due for mammogram screening. [facility] with additional views and no abnormality identified [facility]/nodule with fibrocystic masses s/p lumpectomy L 1998. Known family history of breast cancer sister age 4389Currently asymptomatic. - Due for routine HIV and Hep C screening labs, will get with blood work - Due for DEXA BMD - no known family or personal history of osteoporosis. No prior fragility fractures. No prior Vitamin D result. Not taking Ca/Vit D supplement.  [facility] 04/02[facility]2017  04/03/2015  Decreased Interest 0 0  Down, Depressed, Hopeless 0 0  PHQ - 2 Score 0 0    Past Medical History:  Diagnosis Date  . Anemia   . GERD (gastroesophageal reflux disease)    Past Surgical History:  Procedure Laterality Date  . ABDOMINAL HYSTERECTOMY  1998   partial  . BREAST SURGERY Left 1998   removal of 2 fibrocystic masses  . COLONOSCOPY  2011  . COLONOSCOPY WITH PROPOFOL N/A 12/13/2016   Procedure: COLONOSCOPY WITH PROPOFOL;  Surgeon: AnJonathon BellowsMD;  [facility];  Service: Endoscopy;  Laterality: N/A;  . ESOPHAGOGASTRODUODENOSCOPY (EGD) WITH PROPOFOL N/A 12/13/2016   Procedure: ESOPHAGOGASTRODUODENOSCOPY (EGD) WITH PROPOFOL;  Surgeon: AnJonathon BellowsMD;  [facility];  Service: Endoscopy;  Laterality: N/A;  . TONSILLECTOMY  1996   Social History   Social History  . Marital status: Divorced    Spouse name: N/A  . Number of children: N/A  . Years of education: N/A   Occupational History  . Not on file.   Social History Main Topics  . Smoking status: Never Smoker  . Smokeless tobacco: Never Used  . Alcohol use No  . Drug use: No  . Sexual activity: Yes   Other Topics Concern  . Not on file   Social History Narrative  . No narrative on file   Family History  Problem Relation Age of Onset  . Cancer Mother        pancreatic  . Alzheimer's disease Father   . Cancer Sister  lung  . Cancer Brother        lung  . Breast cancer Sister        45   Current Outpatient Prescriptions on File Prior to Visit  Medication Sig  . aspirin EC 81 MG tablet Take 81 mg by mouth daily.   No current facility-administered medications on file prior to visit.     Review of Systems  Constitutional: Negative for activity change, appetite change, chills, diaphoresis, fatigue and fever.  HENT: Negative for congestion, hearing loss and sinus pressure.   Eyes: Negative for visual disturbance.  Respiratory: Negative for apnea, cough, chest  tightness, shortness of breath and wheezing.   Cardiovascular: Negative for chest pain, palpitations and leg swelling.  Gastrointestinal: Negative for abdominal pain, anal bleeding, blood in stool, constipation, diarrhea, nausea and vomiting.  Endocrine: Negative for cold intolerance and polyuria.  Genitourinary: Negative for decreased urine volume, difficulty urinating, dysuria, frequency, hematuria and vaginal bleeding.  Musculoskeletal: Negative for arthralgias, back pain and neck pain.  Skin: Negative for rash.  Allergic/Immunologic: Negative for environmental allergies.  Neurological: Negative for dizziness, weakness, light-headedness, numbness and headaches.  Hematological: Negative for adenopathy.  Psychiatric/Behavioral: Negative for behavioral problems, dysphoric mood and sleep disturbance. The patient is not nervous/anxious.    Per HPI unless specifically indicated above     Objective:    BP 111/81   Pulse 74   Temp 98.3 F (36.8 C) (Oral)   Resp 16   Ht _0  (1.676 m)   Wt 158 lb (71.7 kg)   BMI 25.50 kg/m   Wt Readings from Last 3 Encounters:  04/02/17 158 lb (71.7 kg)  02/11/17 160 lb 9.6 oz (72.8 kg)  11/05/16 162 lb 3.2 oz (73.6 kg)    Physical Exam  Constitutional: She is oriented to person, place, and time. She appears well-developed and well-nourished. No distress.  Well-appearing, comfortable, cooperative  HENT:  Head: Normocephalic and atraumatic.  Mouth/Throat: Oropharynx is clear and moist.  Frontal / maxillary sinuses non-tender. Nares patent without purulence or edema. Bilateral TMs clear without erythema, effusion or bulging. Oropharynx clear without erythema, exudates, edema or asymmetry.  Eyes: Pupils are equal, round, and reactive to light. Conjunctivae and EOM are normal. Right eye exhibits no discharge. Left eye exhibits no discharge.  Neck: Normal range of motion. Neck supple. No thyromegaly present.  Cardiovascular: Normal rate, regular rhythm,  normal heart sounds and intact distal pulses.   No murmur heard. Pulmonary/Chest: Effort normal and breath sounds normal. No respiratory distress. She has no wheezes. She has no rales.  Abdominal: Soft. Bowel sounds are normal. She exhibits no distension and no mass. There is no tenderness.  Musculoskeletal: Normal range of motion. She exhibits no edema or tenderness.  Upper / Lower Extremities: - Normal muscle tone, strength bilateral upper extremities 5/5, lower extremities 5/5  Lymphadenopathy:    She has no cervical adenopathy.  Neurological: She is alert and oriented to person, place, and time.  Distal sensation intact to light touch all extremities  Skin: Skin is warm and dry. No rash noted. She is not diaphoretic. No erythema.  Psychiatric: She has a normal mood and affect. Her behavior is normal.  Well groomed, good eye contact, normal speech and thoughts  Nursing note and vitals reviewed.  Results for orders placed or performed in visit on 04/01/16  COMPLETE METABOLIC PANEL WITH GFR  Result Value Ref Range   Sodium 143 135 - 146 mmol/L   Potassium 4.2 3.5 -  5.3 mmol/L   Chloride 108 98 - 110 mmol/L   CO2 27 20 - 31 mmol/L   Glucose, Bld 85 65 - 99 mg/dL   BUN 6 (L) 7 - 25 mg/dL   Creat 0.48 (L) 0.50 - 0.99 mg/dL   Total Bilirubin 0.4 0.2 - 1.2 mg/dL   Alkaline Phosphatase 96 33 - 130 U/L   AST 11 10 - 35 U/L   ALT 10 6 - 29 U/L   Total Protein 6.9 6.1 - 8.1 g/dL   Albumin 4.3 3.6 - 5.1 g/dL   Calcium 10.6 (H) 8.6 - 10.4 mg/dL   GFR, Est African American >89 >=60 mL/min   GFR, Est Non African American >89 >=60 mL/min  CBC with Differential  Result Value Ref Range   WBC 5.8 3.8 - 10.8 K/uL   RBC 4.53 3.80 - 5.10 MIL/uL   Hemoglobin 11.9 11.7 - 15.5 g/dL   HCT 36.1 35.0 - 45.0 %   MCV 79.7 (L) 80.0 - 100.0 fL   MCH 26.3 (L) 27.0 - 33.0 pg   MCHC 33.0 32.0 - 36.0 g/dL   RDW 14.6 11.0 - 15.0 %   Platelets 306 140 - 400 K/uL   MPV 10.2 7.5 - 12.5 fL   Neutro Abs  2,726 1,500 - 7,800 cells/uL   Lymphs Abs 2,552 850 - 3,900 cells/uL   Monocytes Absolute 348 200 - 950 cells/uL   Eosinophils Absolute 116 15 - 500 cells/uL   Basophils Absolute 58 0 - 200 cells/uL   Neutrophils Relative % 47 %   Lymphocytes Relative 44 %   Monocytes Relative 6 %   Eosinophils Relative 2 %   Basophils Relative 1 %   Smear Review Criteria for review not met   Lipid Profile  Result Value Ref Range   Cholesterol 203 (H) 125 - 200 mg/dL   Triglycerides 84 <150 mg/dL   HDL 60 >=46 mg/dL   Total CHOL/HDL Ratio 3.4 <=5.0 Ratio   VLDL 17 <30 mg/dL   LDL Cholesterol 126 <130 mg/dL      Assessment & Plan:   Problem List Items Addressed This Visit    Breast cancer screening    Currently asymptomatic and due for routine mammogram breast cancer screening Last mammo 3-09/2014 with mild abnormality L breast found to be likely benign cyst on diagnostic mammo and [place] - No significant abnormal before - Known family history breast cancer sister age 44  Plan: 1. Already has order for routine Mammogram, re-emphasized patient to call [place] to schedule when ready 2. Follow-up      GERD (gastroesophageal reflux disease)    Stable, seems more controlled now on daily H2 blocker instead of PPI - Also complicated by dysphagia vs esophageal motility disorder - Chronic history on PPI, now off >5-6 months - S/p AGI EGD 11/2016, see report  Plan: 1. Reassurance, may continue Zantac 125m daily if working for her, may increase to BID in future if needed for few weeks 2. Follow-up with GI as planned if dysphagia worsens, as mentioned may need esophageal manometry or other studies      Relevant Medications   ranitidine (ZANTAC) 150 MG tablet   Pure hypercholesterolemia    Previously controlled cholesterol on lifestyle Last lipid panel 03/2016  Plan: 1. Due for fasting labs lipids - will return next week 2. Will calculate ASCVD consider statin 3. Continue ASA  822mfor primary ASCVD risk reduction 4. Encourage improved lifestyle - low carb/cholesterol,  reduce portion size, continue improving regular exercise      Relevant Orders   Lipid panel    Other Visit Diagnoses    Annual physical exam    -  Primary   Relevant Orders   COMPLETE METABOLIC PANEL WITH GFR   CBC with Differential/Platelet   Lipid panel   Hemoglobin A1c   Postmenopausal estrogen deficiency       Relevant Orders   DG BONE DENSITY (DXA)   VITAMIN D 25 Hydroxy (Vit-D Deficiency, Fractures)   Need for hepatitis C screening test       Relevant Orders   Hepatitis C antibody   Screening for HIV (human immunodeficiency virus)       Relevant Orders   HIV antibody   Abnormal blood chemistry       Relevant Orders   COMPLETE METABOLIC PANEL WITH GFR   Hemoglobin A1c   Low mean corpuscular volume (MCV)       Relevant Orders   CBC with Differential/Platelet      Meds ordered this encounter  Medications  . ranitidine (ZANTAC) 150 MG tablet    Sig: Take 1 tablet (150 mg total) by mouth daily.    Orders Placed This Encounter  Procedures  . DG BONE DENSITY (DXA)    Standing Status:   Future    Standing Expiration Date:   06/02/2018    Order Specific Question:   Reason for Exam (SYMPTOM  OR DIAGNOSIS REQUIRED)    Answer:   routine screening DEXA for BMD, postmenopausal estrogen deficiency, osteoporosis screening    Order Specific Question:   Preferred imaging location?    Answer:   [facility]  . MM DIAG BREAST TOMO BILATERAL    Standing Status:   Future    Standing Expiration Date:   06/07/2018    Order Specific Question:   Reason for Exam (SYMPTOM  OR DIAGNOSIS REQUIRED)    Answer:   history of abnormal mammogram, requesting diagnostic    Order Specific Question:   Preferred imaging location?    Answer:   [facility]  . US BREAST LTD UNI LEFT INC AXILLA    Standing Status:   Future    Standing Expiration Date:   06/07/2018    Order Specific Question:    Reason for Exam (SYMPTOM  OR DIAGNOSIS REQUIRED)    Answer:   history of abnormal mammogram, requesting diagnostic    Order Specific Question:   Preferred imaging location?    Answer:   [facility]  . US BREAST LTD UNI RIGHT INC AXILLA    Standing Status:   Future    Standing Expiration Date:   06/07/2018    Order Specific Question:   Reason for Exam (SYMPTOM  OR DIAGNOSIS REQUIRED)    Answer:   history of abnormal mammogram, requesting diagnostic    Order Specific Question:   Preferred imaging location?    Answer:   [facility]  . COMPLETE METABOLIC PANEL WITH GFR    Standing Status:   Future    Number of Occurrences:   1    Standing Expiration Date:   04/03/2018  . CBC with Differential/Platelet    Standing Status:   Future    Number of Occurrences:   1    Standing Expiration Date:   04/03/2018  . Lipid panel    Standing Status:   Future    Number of Occurrences:   1    Standing Expiration Date:   04/03/2018  Order Specific Question:   Has the patient fasted?    Answer:   Yes  . Hemoglobin A1c    Standing Status:   Future    Number of Occurrences:   1    Standing Expiration Date:   04/03/2018  . Hepatitis C antibody    Standing Status:   Future    Number of Occurrences:   1    Standing Expiration Date:   04/03/2018  . HIV antibody    Standing Status:   Future    Number of Occurrences:   1    Standing Expiration Date:   04/03/2018  . VITAMIN D 25 Hydroxy (Vit-D Deficiency, Fractures)    Standing Status:   Future    Number of Occurrences:   1    Standing Expiration Date:   04/03/2018     Follow up plan: Return in about 1 year (around 04/02/2018) for Annual Physical.  Future labs ordered for next week.  Nobie Putnam, [facility] 04/03/2017, 6:22 AM

## 2017-04-03 NOTE — Assessment & Plan Note (Signed)
Stable, seems more controlled now on daily H2 blocker instead of PPI - Also complicated by dysphagia vs esophageal motility disorder - Chronic history on PPI, now off >5-6 months - S/p AGI EGD 11/2016, see report  Plan: 1. Reassurance, may continue Zantac 150mg  daily if working for her, may increase to BID in future if needed for few weeks 2. Follow-up with GI as planned if dysphagia worsens, as mentioned may need esophageal manometry or other studies

## 2017-04-03 NOTE — Assessment & Plan Note (Signed)
Previously controlled cholesterol on lifestyle Last lipid panel 03/2016  Plan: 1. Due for fasting labs lipids - will return next week 2. Will calculate ASCVD consider statin 3. Continue ASA 81mg  for primary ASCVD risk reduction 4. Encourage improved lifestyle - low carb/cholesterol, reduce portion size, continue improving regular exercise

## 2017-04-03 NOTE — Assessment & Plan Note (Signed)
Currently asymptomatic and due for routine mammogram breast cancer screening Last mammo 3-09/2014 with mild abnormality L breast found to be likely benign cyst on diagnostic mammo and US - No significant abnormal before - Known family history breast cancer sister age 65  Plan: 1. Already has order for routine Mammogram, re-emphasized patient [facility] 2. Follow-up

## 2017-04-07 ENCOUNTER — Other Ambulatory Visit: Payer: 59

## 2017-04-07 DIAGNOSIS — R799 Abnormal finding of blood chemistry, unspecified: Secondary | ICD-10-CM

## 2017-04-07 DIAGNOSIS — R718 Other abnormality of red blood cells: Secondary | ICD-10-CM

## 2017-04-07 DIAGNOSIS — Z780 Asymptomatic menopausal state: Secondary | ICD-10-CM

## 2017-04-07 DIAGNOSIS — Z1159 Encounter for screening for other viral diseases: Secondary | ICD-10-CM

## 2017-04-07 DIAGNOSIS — E7800 Pure hypercholesterolemia, unspecified: Secondary | ICD-10-CM

## 2017-04-07 DIAGNOSIS — Z0000 Encounter for general adult medical examination without abnormal findings: Secondary | ICD-10-CM

## 2017-04-07 DIAGNOSIS — Z114 Encounter for screening for human immunodeficiency virus [HIV]: Secondary | ICD-10-CM

## 2017-04-07 NOTE — Addendum Note (Signed)
Addended by: Smitty CordsKARAMALEGOS, Thetis Schwimmer J on: 04/07/2017 08:55 AM   Modules accepted: Orders

## 2017-04-08 ENCOUNTER — Encounter: Payer: Self-pay | Admitting: Family Medicine

## 2017-04-08 DIAGNOSIS — E559 Vitamin D deficiency, unspecified: Secondary | ICD-10-CM | POA: Insufficient documentation

## 2017-04-08 LAB — LIPID PANEL
CHOL/HDL RATIO: 3 (calc) (ref <5.0)
Cholesterol: 182 mg/dL (ref <200)
HDL: 61 mg/dL (ref >50)
LDL Cholesterol (Calc): 106 mg/dL (calc) — ABNORMAL HIGH
NON-HDL CHOLESTEROL (CALC): 121 mg/dL (ref <130)
Triglycerides: 60 mg/dL (ref <150)

## 2017-04-08 LAB — COMPLETE METABOLIC PANEL WITH GFR
AG Ratio: 1.9 (calc) (ref 1.0–2.5)
ALKALINE PHOSPHATASE (APISO): 87 U/L (ref 33–130)
ALT: 9 U/L (ref 6–29)
AST: 9 U/L — ABNORMAL LOW (ref 10–35)
Albumin: 4.2 g/dL (ref 3.6–5.1)
BILIRUBIN TOTAL: 0.3 mg/dL (ref 0.2–1.2)
BUN: 11 mg/dL (ref 7–25)
CO2: 27 mmol/L (ref 20–32)
Calcium: 10 mg/dL (ref 8.6–10.4)
Chloride: 109 mmol/L (ref 98–110)
Creat: 0.73 mg/dL (ref 0.50–0.99)
GFR, Est African American: 100 mL/min/{1.73_m2} (ref >=60)
GFR, Est Non African American: 86 mL/min/{1.73_m2} (ref >=60)
GLOBULIN: 2.2 g/dL (ref 1.9–3.7)
Glucose, Bld: 87 mg/dL (ref 65–99)
Potassium: 3.8 mmol/L (ref 3.5–5.3)
Sodium: 142 mmol/L (ref 135–146)
Total Protein: 6.4 g/dL (ref 6.1–8.1)

## 2017-04-08 LAB — CBC WITH DIFFERENTIAL/PLATELET
BASOS ABS: 58 {cells}/uL (ref 0–200)
Basophils Relative: 0.8 %
EOS ABS: 79 {cells}/uL (ref 15–500)
Eosinophils Relative: 1.1 %
HEMATOCRIT: 36 % (ref 35.0–45.0)
HEMOGLOBIN: 12.1 g/dL (ref 11.7–15.5)
LYMPHS ABS: 2354 {cells}/uL (ref 850–3900)
MCH: 26.8 pg — AB (ref 27.0–33.0)
MCHC: 33.6 g/dL (ref 32.0–36.0)
MCV: 79.6 fL — AB (ref 80.0–100.0)
MPV: 11.4 fL (ref 7.5–12.5)
Monocytes Relative: 5.7 %
NEUTROS ABS: 4298 {cells}/uL (ref 1500–7800)
Neutrophils Relative %: 59.7 %
Platelets: 297 10*3/uL (ref 140–400)
RBC: 4.52 10*6/uL (ref 3.80–5.10)
RDW: 13.8 % (ref 11.0–15.0)
Total Lymphocyte: 32.7 %
WBC mixed population: 410 cells/uL (ref 200–950)
WBC: 7.2 10*3/uL (ref 3.8–10.8)

## 2017-04-08 LAB — HEPATITIS C ANTIBODY
Hepatitis C Ab: NONREACTIVE
SIGNAL TO CUT-OFF: 0.02 (ref <1.00)

## 2017-04-08 LAB — HEMOGLOBIN A1C
HEMOGLOBIN A1C: 5.5 %{Hb} (ref <5.7)
Mean Plasma Glucose: 111 (calc)
eAG (mmol/L): 6.2 (calc)

## 2017-04-08 LAB — VITAMIN D 25 HYDROXY (VIT D DEFICIENCY, FRACTURES): VIT D 25 HYDROXY: 13 ng/mL — AB (ref 30–100)

## 2017-04-08 LAB — HIV ANTIBODY (ROUTINE TESTING W REFLEX): HIV 1&2 Ab, 4th Generation: NONREACTIVE

## 2017-05-06 ENCOUNTER — Ambulatory Visit
Admission: RE | Admit: 2017-05-06 | Discharge: 2017-05-06 | Disposition: A | Discharge status: Home / Self Care | Payer: No Typology Code available for payment source | Source: Ambulatory Visit | Attending: Family Medicine | Admitting: Family Medicine

## 2017-05-06 DIAGNOSIS — Z87898 Personal history of other specified conditions: Secondary | ICD-10-CM

## 2017-05-06 DIAGNOSIS — Z1239 Encounter for other screening for malignant neoplasm of breast: Secondary | ICD-10-CM

## 2017-05-06 DIAGNOSIS — N6324 Unspecified lump in the left breast, lower inner quadrant: Secondary | ICD-10-CM | POA: Diagnosis not present

## 2017-05-06 DIAGNOSIS — Z1231 Encounter for screening mammogram for malignant neoplasm of breast: Secondary | ICD-10-CM | POA: Insufficient documentation

## 2018-08-18 ENCOUNTER — Other Ambulatory Visit: Payer: Self-pay

## 2018-08-18 ENCOUNTER — Ambulatory Visit (INDEPENDENT_AMBULATORY_CARE_PROVIDER_SITE_OTHER): Payer: 59 | Admitting: Family Medicine

## 2018-08-18 ENCOUNTER — Encounter: Payer: Self-pay | Admitting: Family Medicine

## 2018-08-18 VITALS — BP 124/78 | HR 80 | Temp 98.2°F | Resp 16 | Ht 65.0 in | Wt 160.8 lb

## 2018-08-18 DIAGNOSIS — Z1239 Encounter for other screening for malignant neoplasm of breast: Secondary | ICD-10-CM | POA: Diagnosis not present

## 2018-08-18 DIAGNOSIS — Z23 Encounter for immunization: Secondary | ICD-10-CM

## 2018-08-18 DIAGNOSIS — Z0000 Encounter for general adult medical examination without abnormal findings: Secondary | ICD-10-CM | POA: Diagnosis not present

## 2018-08-18 DIAGNOSIS — K219 Gastro-esophageal reflux disease without esophagitis: Secondary | ICD-10-CM | POA: Diagnosis not present

## 2018-08-18 DIAGNOSIS — E7800 Pure hypercholesterolemia, unspecified: Secondary | ICD-10-CM

## 2018-08-18 DIAGNOSIS — E559 Vitamin D deficiency, unspecified: Secondary | ICD-10-CM

## 2018-08-18 NOTE — Progress Notes (Signed)
Subjective:    Patient ID: Sophia Schmidt, female    DOB: [birth year], 68 y.o.   MRN: [id number]  Sophia Schmidt is a 68 y.o. female presenting on 08/18/2018 for Annual Exam   HPI   Here for Annual Physical. No labs obtained before visit, will return for labs.   Vitamin D Deficiency Past Vitamin D was low at result 13 - trial on vitamin D since off now. Re-check due  HYPERLIPIDEMIA: - Reports no concerns. Last lipid panel 03/2017, mostly controlled  mild elevated LDL 106 - Not taking a Statin or cholesterol medication - She is taking ASA 81mg  daily some missed doses Lifestyle - Weight stable in past >1 year - Diet: improved diet, balanced - Exercise: walking  FOLLOW-UP GERD Prior history of GERD and Dysphagia. History of EGD Colonoscopy, see prior note - Currently doing well, only takes PRN OTC Antacid, was on Zantac but off now due to recall - No new concerns.  Health Maintenance: Agrees to pneumonia vaccine prevnar-13 1st dose, then repeat pneumovax-23 in 1 year, now >age 32  - Due for Flu Shot, declines today despite counseling on benefits  - Breast CA Screening: Due for mammogram screening. Last mammogram at [facility] 04/2017 had birads 2 L breast 8 oclock nodule - had [place] negative. And history of prior abnormal results with abnormal mass/nodule with fibrocystic masses s/p lumpectomy L 1998. Known family history of breast cancer sister age 69. Currently asymptomatic.  - Due for DEXA BMD - no known family or personal history of osteoporosis. No prior fragility fractures. No prior Vitamin D result. DECLINED  Depression screen [facility] 2/9 08/18/2018 04/02/2017 04/03/2015  Decreased Interest 0 0 0  Down, Depressed, Hopeless 0 0 0  PHQ - 2 Score 0 0 0    Past Medical History:  Diagnosis Date  . Anemia   . GERD (gastroesophageal reflux disease)    Past Surgical History:  Procedure Laterality Date  . ABDOMINAL HYSTERECTOMY  1998   partial  . BREAST CYST ASPIRATION Left  10/17/2014   FNA done by Dr. Evette Cristal  . BREAST EXCISIONAL BIOPSY Left 1998   x 2  . BREAST SURGERY Left 1998   removal of 2 fibrocystic masses  . COLONOSCOPY  2011  . COLONOSCOPY WITH PROPOFOL N/A 12/13/2016   Procedure: COLONOSCOPY WITH PROPOFOL;  Surgeon: Wyline Mood, MD;  Location: [facility] ENDOSCOPY;  Service: Endoscopy;  Laterality: N/A;  . ESOPHAGOGASTRODUODENOSCOPY (EGD) WITH PROPOFOL N/A 12/13/2016   Procedure: ESOPHAGOGASTRODUODENOSCOPY (EGD) WITH PROPOFOL;  Surgeon: Wyline Mood, MD;  Location: [facility] ENDOSCOPY;  Service: Endoscopy;  Laterality: N/A;  . TONSILLECTOMY  1996   Social History   Socioeconomic History  . Marital status: Divorced    Spouse name: Not on file  . Number of children: Not on file  . Years of education: Not on file  . Highest education level: Not on file  Occupational History  . Not on file  Social Needs  . Financial resource strain: Not on file  . Food insecurity:    Worry: Not on file    Inability: Not on file  . Transportation needs:    Medical: Not on file    Non-medical: Not on file  Tobacco Use  . Smoking status: Never Smoker  . Smokeless tobacco: Never Used  Substance and Sexual Activity  . Alcohol use: No    Alcohol/week: 0.0 standard drinks  . Drug use: No  . Sexual activity: Yes  Lifestyle  . Physical activity:  Days per week: Not on file    Minutes per session: Not on file  . Stress: Not on file  Relationships  . Social connections:    Talks on phone: Not on file    Gets together: Not on file    Attends religious service: Not on file    Active member of club or organization: Not on file    Attends meetings of clubs or organizations: Not on file    Relationship status: Not on file  . Intimate partner violence:    Fear of current or ex partner: Not on file    Emotionally abused: Not on file    Physically abused: Not on file    Forced sexual activity: Not on file  Other Topics Concern  . Not on file  Social History Narrative    . Not on file   Family History  Problem Relation Age of Onset  . Cancer Mother        pancreatic  . Alzheimer's disease Father   . Cancer Sister        lung  . Cancer Brother        lung  . Breast cancer Sister        6   Current Outpatient Medications on File Prior to Visit  Medication Sig  . aspirin EC 81 MG tablet Take 81 mg by mouth daily.   No current facility-administered medications on file prior to visit.     Review of Systems  Constitutional: Negative for activity change, appetite change, chills, diaphoresis, fatigue and fever.  HENT: Negative for congestion and hearing loss.   Eyes: Negative for visual disturbance.  Respiratory: Negative for apnea, cough, choking, chest tightness, shortness of breath and wheezing.   Cardiovascular: Negative for chest pain, palpitations and leg swelling.  Gastrointestinal: Negative for abdominal pain, anal bleeding, blood in stool, constipation, diarrhea, nausea and vomiting.  Endocrine: Negative for cold intolerance.  Genitourinary: Negative for difficulty urinating, dysuria, frequency and hematuria.  Musculoskeletal: Negative for arthralgias, back pain and neck pain.  Skin: Negative for rash.  Allergic/Immunologic: Negative for environmental allergies.  Neurological: Negative for dizziness, weakness, light-headedness, numbness and headaches.  Hematological: Negative for adenopathy.  Psychiatric/Behavioral: Negative for behavioral problems, dysphoric mood and sleep disturbance. The patient is not nervous/anxious.    Per HPI unless specifically indicated above     Objective:    BP 124/78   Pulse 80   Temp 98.2 F (36.8 C) (Oral)   Resp 16   Ht  (1.651 m)   Wt 160 lb 12.8 oz (72.9 kg)   SpO2 100%   BMI 26.76 kg/m   Wt Readings from Last 3 Encounters:  08/18/18 160 lb 12.8 oz (72.9 kg)  04/02/17 158 lb (71.7 kg)  02/11/17 160 lb 9.6 oz (72.8 kg)    Physical Exam Vitals signs and nursing note reviewed.   Constitutional:      General: She is not in acute distress.    Appearance: She is well-developed. She is not diaphoretic.     Comments: Well-appearing, comfortable, cooperative  HENT:     Head: Normocephalic and atraumatic.     Comments: Frontal / maxillary sinuses non-tender. Nares patent without congestion  Bilateral TMs clear without erythema, effusion or bulging. Oropharynx clear without erythema, exudates, edema or asymmetry. Eyes:     General:        Right eye: No discharge.        Left eye: No discharge.  Conjunctiva/sclera: Conjunctivae normal.     Pupils: Pupils are equal, round, and reactive to light.  Neck:     Musculoskeletal: Normal range of motion and neck supple.     Thyroid: No thyromegaly.  Cardiovascular:     Rate and Rhythm: Normal rate and regular rhythm.     Heart sounds: Normal heart sounds. No murmur.  Pulmonary:     Effort: Pulmonary effort is normal. No respiratory distress.     Breath sounds: Normal breath sounds. No wheezing or rales.  Abdominal:     General: Bowel sounds are normal. There is no distension.     Palpations: Abdomen is soft. There is no mass.     Tenderness: There is no abdominal tenderness.  Musculoskeletal: Normal range of motion.        General: No tenderness.     Comments: Upper / Lower Extremities: - Normal muscle tone, strength bilateral upper extremities 5/5, lower extremities 5/5  Lymphadenopathy:     Cervical: No cervical adenopathy.  Skin:    General: Skin is warm and dry.     Findings: No erythema or rash.     Comments: Callus on bilateral forefeet  Neurological:     Mental Status: She is alert and oriented to person, place, and time.     Comments: Distal sensation intact to light touch all extremities  Psychiatric:        Behavior: Behavior normal.     Comments: Well groomed, good eye contact, normal speech and thoughts    Results for orders placed or performed in visit on 04/07/17  COMPLETE METABOLIC PANEL WITH  GFR  Result Value Ref Range   Glucose, Bld 87 65 - 99 mg/dL   BUN 11 7 - 25 mg/dL   Creat 1.610.73 0.960.50 - 0.450.99 mg/dL   GFR, Est Non African American 86 > OR = 60 mL/min/1.4273m2   GFR, Est African American 100 > OR = 60 mL/min/1.3873m2   BUN/Creatinine Ratio NOT APPLICABLE 6 - 22 (calc)   Sodium 142 135 - 146 mmol/L   Potassium 3.8 3.5 - 5.3 mmol/L   Chloride 109 98 - 110 mmol/L   CO2 27 20 - 32 mmol/L   Calcium 10.0 8.6 - 10.4 mg/dL   Total Protein 6.4 6.1 - 8.1 g/dL   Albumin 4.2 3.6 - 5.1 g/dL   Globulin 2.2 1.9 - 3.7 g/dL (calc)   AG Ratio 1.9 1.0 - 2.5 (calc)   Total Bilirubin 0.3 0.2 - 1.2 mg/dL   Alkaline phosphatase (APISO) 87 33 - 130 U/L   AST 9 (L) 10 - 35 U/L   ALT 9 6 - 29 U/L  CBC with Differential/Platelet  Result Value Ref Range   WBC 7.2 3.8 - 10.8 Thousand/uL   RBC 4.52 3.80 - 5.10 Million/uL   Hemoglobin 12.1 11.7 - 15.5 g/dL   HCT 40.936.0 81.135.0 - 91.445.0 %   MCV 79.6 (L) 80.0 - 100.0 fL   MCH 26.8 (L) 27.0 - 33.0 pg   MCHC 33.6 32.0 - 36.0 g/dL   RDW 78.213.8 95.611.0 - 21.315.0 %   Platelets 297 140 - 400 Thousand/uL   MPV 11.4 7.5 - 12.5 fL   Neutro Abs 4,298 1,500 - 7,800 cells/uL   Lymphs Abs 2,354 850 - 3,900 cells/uL   WBC mixed population 410 200 - 950 cells/uL   Eosinophils Absolute 79 15 - 500 cells/uL   Basophils Absolute 58 0 - 200 cells/uL   Neutrophils Relative % 59.7 %  Total Lymphocyte 32.7 %   Monocytes Relative 5.7 %   Eosinophils Relative 1.1 %   Basophils Relative 0.8 %  Lipid panel  Result Value Ref Range   Cholesterol 182 <200 mg/dL   HDL 61 >34 mg/dL   Triglycerides 60 <917 mg/dL   LDL Cholesterol (Calc) 106 (H) mg/dL (calc)   Total CHOL/HDL Ratio 3.0 <5.0 (calc)   Non-HDL Cholesterol (Calc) 121 <130 mg/dL (calc)  Hemoglobin H1T  Result Value Ref Range   Hgb A1c MFr Bld 5.5 <5.7 % of total Hgb   Mean Plasma Glucose 111 (calc)   eAG (mmol/L) 6.2 (calc)  Hepatitis C antibody  Result Value Ref Range   Hepatitis C Ab NON-REACTIVE NON-REACTI    SIGNAL TO CUT-OFF 0.02 <1.00  HIV antibody  Result Value Ref Range   HIV 1&2 Ab, 4th Generation NON-REACTIVE NON-REACTI  VITAMIN D 25 Hydroxy (Vit-D Deficiency, Fractures)  Result Value Ref Range   Vit D, 25-Hydroxy 13 (L) 30 - 100 ng/mL      Assessment & Plan:   Problem List Items Addressed This Visit    Breast cancer screening    Asymptomatic Fam history of breast cancer Prior personal history of abnormal nodule vs cyst  Order Mammogram screen at [facility] now as she is due, will modify order as requested per Delford Field      Relevant Orders   MM DIGITAL SCREENING BILATERAL   GERD (gastroesophageal reflux disease)    Stable, seems more controlled now on PRN antacid, off H2 blocker and PPI - Prior history complicated by dysphagia vs esophageal motility disorder - Off chronic PPI - S/p AGI EGD 11/2016, see report  Plan: 1. Reassurance, may continue OTC antacid PRN 2. Follow-up with GI as planned if dysphagia worsens, as mentioned may need esophageal manometry or other studies      Pure hypercholesterolemia    Previously controlled cholesterol on lifestyle Last lipid panel 03/2017  Plan: 1. Due for fasting labs lipids - will return next week 2. Will calculate ASCVD reconsider statin 3. Continue ASA 81mg  for primary ASCVD risk reduction 4. Encourage improved lifestyle - low carb/cholesterol, reduce portion size, continue improving regular exercise      Vitamin D deficiency Check vitamin D - She declined DEXA Restart Vit D supplement vs treatment dose based on result    Other Visit Diagnoses    Annual physical exam    -  Primary    Need for vaccination with 13-polyvalent pneumococcal conjugate vaccine       Relevant Orders   Pneumococcal conjugate vaccine 13-valent IM (Completed)      Updated Health Maintenance information ORDER future fasting labs and will review lab results with patient after Encouraged improvement to lifestyle with diet and exercise   No  orders of the defined types were placed in this encounter.   Follow up plan: Return in about 1 year (around 08/18/2019) for Annual Physical.   Future labs ordered for 1 week approx.  Saralyn Pilar, DO [facility] 08/18/2018, 3:10 PM

## 2018-08-18 NOTE — Patient Instructions (Addendum)
Thank you for coming to the office today.  Pneumonia vaccine today Prevnar 13 - may need a booster in future.  For Mammogram screening for breast cancer   BP is re-checked - and is normal.  Keep up the good work  Call the Imaging Center below anytime to schedule your own appointment now that order has been placed.  [facility] [address] [place], [place] [address] Phone: [phone]   DUE for FASTING BLOOD WORK (no food or drink after midnight before the lab appointment, only water or coffee without cream/sugar on the morning of)  SCHEDULE "Lab Only" visit in the morning at the clinic for lab draw in 1-2 WEEKS   - Make sure Lab Only appointment is at about 1 week before your next appointment, so that results will be available  For Lab Results, once available within 2-3 days of blood draw, you can can log in to MyChart online to view your results and a brief explanation. Also, we can discuss results at next follow-up visit.   Please schedule a Follow-up Appointment to: Return in about 1 year (around 08/18/2019) for Annual Physical.  If you have any other questions or concerns, please feel free to call the office or send a message through MyChart. You may also schedule an earlier appointment if necessary.  Additionally, you may be receiving a survey about your experience at our office within a few days to 1 week by e-mail or mail. We value your feedback.  Saralyn Pilar, DO [facility] [place]

## 2018-08-19 ENCOUNTER — Other Ambulatory Visit: Payer: Self-pay | Admitting: Family Medicine

## 2018-08-19 DIAGNOSIS — E7800 Pure hypercholesterolemia, unspecified: Secondary | ICD-10-CM

## 2018-08-19 DIAGNOSIS — Z0000 Encounter for general adult medical examination without abnormal findings: Secondary | ICD-10-CM

## 2018-08-19 DIAGNOSIS — K219 Gastro-esophageal reflux disease without esophagitis: Secondary | ICD-10-CM

## 2018-08-19 DIAGNOSIS — E559 Vitamin D deficiency, unspecified: Secondary | ICD-10-CM

## 2018-08-19 NOTE — Assessment & Plan Note (Signed)
Previously controlled cholesterol on lifestyle Last lipid panel 03/2017  Plan: 1. Due for fasting labs lipids - will return next week 2. Will calculate ASCVD reconsider statin 3. Continue ASA 81mg  for primary ASCVD risk reduction 4. Encourage improved lifestyle - low carb/cholesterol, reduce portion size, continue improving regular exercise

## 2018-08-19 NOTE — Assessment & Plan Note (Signed)
Stable, seems more controlled now on PRN antacid, off H2 blocker and PPI - Prior history complicated by dysphagia vs esophageal motility disorder - Off chronic PPI - S/p AGI EGD 11/2016, see report  Plan: 1. Reassurance, may continue OTC antacid PRN 2. Follow-up with GI as planned if dysphagia worsens, as mentioned may need esophageal manometry or other studies

## 2018-08-19 NOTE — Assessment & Plan Note (Signed)
Asymptomatic Fam history of breast cancer Prior personal history of abnormal nodule vs cyst  Order Mammogram screen at [facility] now as she is due, will modify order as requested per Delford Field

## 2018-08-20 ENCOUNTER — Other Ambulatory Visit: Payer: Self-pay

## 2018-08-20 DIAGNOSIS — Z0000 Encounter for general adult medical examination without abnormal findings: Secondary | ICD-10-CM

## 2018-08-20 DIAGNOSIS — E7800 Pure hypercholesterolemia, unspecified: Secondary | ICD-10-CM

## 2018-08-20 DIAGNOSIS — E559 Vitamin D deficiency, unspecified: Secondary | ICD-10-CM

## 2018-08-20 DIAGNOSIS — K219 Gastro-esophageal reflux disease without esophagitis: Secondary | ICD-10-CM

## 2018-08-21 ENCOUNTER — Other Ambulatory Visit: Payer: Self-pay | Admitting: Family Medicine

## 2018-08-21 DIAGNOSIS — L84 Corns and callosities: Secondary | ICD-10-CM

## 2018-08-21 LAB — COMPLETE METABOLIC PANEL WITH GFR
AG RATIO: 1.9 (calc) (ref 1.0–2.5)
ALBUMIN MSPROF: 4.2 g/dL (ref 3.6–5.1)
ALKALINE PHOSPHATASE (APISO): 98 U/L (ref 37–153)
ALT: 10 U/L (ref 6–29)
AST: 12 U/L (ref 10–35)
BILIRUBIN TOTAL: 0.4 mg/dL (ref 0.2–1.2)
BUN: 8 mg/dL (ref 7–25)
CHLORIDE: 110 mmol/L (ref 98–110)
CO2: 27 mmol/L (ref 20–32)
Calcium: 10.5 mg/dL — ABNORMAL HIGH (ref 8.6–10.4)
Creat: 0.62 mg/dL (ref 0.50–0.99)
GFR, EST AFRICAN AMERICAN: 109 mL/min/{1.73_m2} (ref >=60)
GFR, Est Non African American: 94 mL/min/{1.73_m2} (ref >=60)
Globulin: 2.2 g/dL (calc) (ref 1.9–3.7)
Glucose, Bld: 80 mg/dL (ref 65–99)
POTASSIUM: 4.9 mmol/L (ref 3.5–5.3)
SODIUM: 145 mmol/L (ref 135–146)
TOTAL PROTEIN: 6.4 g/dL (ref 6.1–8.1)

## 2018-08-21 LAB — CBC WITH DIFFERENTIAL/PLATELET
Absolute Monocytes: 270 cells/uL (ref 200–950)
BASOS ABS: 58 {cells}/uL (ref 0–200)
Basophils Relative: 1.1 %
EOS ABS: 191 {cells}/uL (ref 15–500)
Eosinophils Relative: 3.6 %
HEMATOCRIT: 35.5 % (ref 35.0–45.0)
HEMOGLOBIN: 11.8 g/dL (ref 11.7–15.5)
LYMPHS ABS: 1786 {cells}/uL (ref 850–3900)
MCH: 26.9 pg — AB (ref 27.0–33.0)
MCHC: 33.2 g/dL (ref 32.0–36.0)
MCV: 80.9 fL (ref 80.0–100.0)
MONOS PCT: 5.1 %
MPV: 11.2 fL (ref 7.5–12.5)
NEUTROS ABS: 2995 {cells}/uL (ref 1500–7800)
NEUTROS PCT: 56.5 %
Platelets: 295 10*3/uL (ref 140–400)
RBC: 4.39 10*6/uL (ref 3.80–5.10)
RDW: 13.7 % (ref 11.0–15.0)
Total Lymphocyte: 33.7 %
WBC: 5.3 10*3/uL (ref 3.8–10.8)

## 2018-08-21 LAB — HEMOGLOBIN A1C
HEMOGLOBIN A1C: 5.5 %{Hb} (ref <5.7)
Mean Plasma Glucose: 111 (calc)
eAG (mmol/L): 6.2 (calc)

## 2018-08-21 LAB — LIPID PANEL
CHOLESTEROL: 191 mg/dL (ref <200)
HDL: 59 mg/dL (ref >=50)
LDL Cholesterol (Calc): 117 mg/dL (calc) — ABNORMAL HIGH
Non-HDL Cholesterol (Calc): 132 mg/dL (calc) — ABNORMAL HIGH (ref <130)
Total CHOL/HDL Ratio: 3.2 (calc) (ref <5.0)
Triglycerides: 60 mg/dL (ref <150)

## 2018-08-21 LAB — VITAMIN D 25 HYDROXY (VIT D DEFICIENCY, FRACTURES): VIT D 25 HYDROXY: 7 ng/mL — AB (ref 30–100)

## 2018-08-21 LAB — TSH: TSH: 4.83 m[IU]/L — AB (ref 0.40–4.50)

## 2018-08-21 LAB — T4, FREE: FREE T4: 1 ng/dL (ref 0.8–1.8)

## 2018-08-28 ENCOUNTER — Other Ambulatory Visit: Payer: Self-pay

## 2018-08-28 ENCOUNTER — Ambulatory Visit
Admission: RE | Admit: 2018-08-28 | Discharge: 2018-08-28 | Disposition: A | Discharge status: Home / Self Care | Payer: No Typology Code available for payment source | Source: Ambulatory Visit | Attending: Family Medicine | Admitting: Family Medicine

## 2018-08-28 DIAGNOSIS — Z1231 Encounter for screening mammogram for malignant neoplasm of breast: Secondary | ICD-10-CM | POA: Diagnosis not present

## 2018-08-28 DIAGNOSIS — Z1239 Encounter for other screening for malignant neoplasm of breast: Secondary | ICD-10-CM

## 2018-09-16 ENCOUNTER — Ambulatory Visit (INDEPENDENT_AMBULATORY_CARE_PROVIDER_SITE_OTHER): Payer: 59 | Admitting: Family Medicine

## 2018-09-16 ENCOUNTER — Other Ambulatory Visit: Payer: Self-pay

## 2018-09-16 ENCOUNTER — Encounter: Payer: Self-pay | Admitting: Family Medicine

## 2018-09-16 DIAGNOSIS — H1032 Unspecified acute conjunctivitis, left eye: Secondary | ICD-10-CM

## 2018-09-16 MED ORDER — POLYMYXIN B-TRIMETHOPRIM 10000-0.1 UNIT/ML-% OP SOLN: 1.0000 [drp] | Freq: Four times a day (QID) | OPHTHALMIC | 0 refills | Status: DC

## 2018-09-16 NOTE — Patient Instructions (Signed)
No mychart, did not send AVS - instructions were given over phone, see A&P

## 2018-09-16 NOTE — Progress Notes (Signed)
Virtual Visit via Telephone The purpose of this virtual visit is to provide medical care while limiting exposure to the novel coronavirus (COVID19) for both patient and office staff.  Consent was obtained for phone visit:  Yes.   Answered questions that patient had about telehealth interaction:  Yes.   I discussed the limitations, risks, security and privacy concerns of performing an evaluation and management service by telephone. I also discussed with the patient that there may be a patient responsible charge related to this service. The patient expressed understanding and agreed to proceed.  Patient Location: Home Provider Location: [facility] (Office)  ---------------------------------------------------------------------- CC: Left Eye Conjunctivitis  Chief Complaint  Patient presents with  . Conjunctivitis    pt states she washing her hair and shampoo got in her Left eye and now her eye been red. She denies itching, burning or drainage. x 1 day    S: Reviewed CMA telephone note below. I have called patient and gathered additional HPI as follows:  Reports that symptoms started last night after showering got shampoo regular in her L eye and it burned and she rubbed her eye, but denies scratching it. Her R eye is unaffected. No other related symptoms. She is asking about possibility of viral syndrome or other causes. - Tried OTC Vizene drops without relief  Patient is currently at home in isolation Denies any high risk travel to areas of current concern for COVID19. Denies any known or suspected exposure to person with or possibly with COVID19.  Admits Left eye irritated and red Denies any eye pain, reduced vision or blurry vision, fevers, chills, sweats, body ache, cough, shortness of breath, sinus pain or pressure, headache, abdominal pain, diarrhea  Past Medical History:  Diagnosis Date  . Anemia   . GERD (gastroesophageal reflux disease)    Social History    Tobacco Use  . Smoking status: Never Smoker  . Smokeless tobacco: Never Used  Substance Use Topics  . Alcohol use: No    Alcohol/week: 0.0 standard drinks  . Drug use: No    Current Outpatient Medications:  .  aspirin EC 81 MG tablet, Take 81 mg by mouth daily., Disp: , Rfl:  .  trimethoprim-polymyxin b (POLYTRIM) ophthalmic solution, Place 1 drop into the left eye 4 (four) times daily. For 1 week, Disp: 10 mL, Rfl: 0  Depression screen [facility] 2/9 08/18/2018 04/02/2017 04/03/2015  Decreased Interest 0 0 0  Down, Depressed, Hopeless 0 0 0  PHQ - 2 Score 0 0 0    No flowsheet data found.  -------------------------------------------------------------------------- O: No physical exam performed due to remote telephone encounter.   -------------------------------------------------------------------------- A&P:  Problem List Items Addressed This Visit    None    Visit Diagnoses    Acute conjunctivitis of left eye, unspecified acute conjunctivitis type    -  Primary   Relevant Medications   trimethoprim-polymyxin b (POLYTRIM) ophthalmic solution     Acute L conjunctivitis for past 1 day, with mild scleral/conjunctival injection without discharge currently  Likely secondary to direct irritant in eye from shampoo Possibly early viral vs allergic as well No sign of bacterial based on history - Otherwise asymptomatic - Reported normal visual acuity  Plan: 1. Reassurance, most likely self limited, empiric coverage with Start Polytrim antibiotic eye drops 1 drop in L both eye 4 times a day 2. Start moist warm compresses over Left eyelid 10-15 min at a time, up to 4-6 times daily until resolution 3. Frequent hand  washing, avoid rubbing / scratching eye 4. Strict return precautions for spreading infection 5. Follow-up 1-2 weeks as needed    Meds ordered this encounter  Medications  . trimethoprim-polymyxin b (POLYTRIM) ophthalmic solution    Sig: Place 1 drop into the left eye  4 (four) times daily. For 1 week    Dispense:  10 mL    Refill:  0    Follow-up: - Return PRN if worsening  Patient verbalizes understanding with the above medical recommendations including the limitation of remote medical advice.  Specific follow-up and call-back criteria were given for patient to follow-up or seek medical care more urgently if needed.   - Time spent in direct consultation with patient on phone: 7 minutes  Saralyn Pilar, DO [facility] 09/16/2018, 11:07 AM

## 2018-12-02 ENCOUNTER — Other Ambulatory Visit: Payer: Self-pay

## 2018-12-02 ENCOUNTER — Encounter: Payer: Self-pay | Admitting: Family Medicine

## 2018-12-02 ENCOUNTER — Ambulatory Visit (INDEPENDENT_AMBULATORY_CARE_PROVIDER_SITE_OTHER): Payer: 59 | Admitting: Family Medicine

## 2018-12-02 DIAGNOSIS — K219 Gastro-esophageal reflux disease without esophagitis: Secondary | ICD-10-CM | POA: Diagnosis not present

## 2018-12-02 DIAGNOSIS — R1013 Epigastric pain: Secondary | ICD-10-CM | POA: Diagnosis not present

## 2018-12-02 MED ORDER — PANTOPRAZOLE SODIUM 40 MG PO TBEC: 40.0000 mg | DELAYED_RELEASE_TABLET | Freq: Every day | ORAL | 2 refills | Status: DC

## 2018-12-02 MED ORDER — SUCRALFATE 1 G PO TABS: 1.0000 g | ORAL_TABLET | Freq: Three times a day (TID) | ORAL | 1 refills | Status: DC

## 2018-12-02 NOTE — Progress Notes (Signed)
Virtual Visit via Telephone The purpose of this virtual visit is to provide medical care while limiting exposure to the novel coronavirus (COVID19) for both patient and office staff.  Consent was obtained for phone visit:  Yes.   Answered questions that patient had about telehealth interaction:  Yes.   I discussed the limitations, risks, security and privacy concerns of performing an evaluation and management service by telephone. I also discussed with the patient that there may be a patient responsible charge related to this service. The patient expressed understanding and agreed to proceed.  Patient Location: Home Provider Location: [facility])  ---------------------------------------------------------------------- Chief Complaint  Patient presents with  . Abdominal Pain    every time patient eats gets upper abdominal pain, acid reflux pills taken as per patient onset 2 weeks nausea and throwing up with pain    S: Reviewed CMA documentation. I have called patient and gathered additional HPI as follows:  GERD / Acute Epigastric Abdominal Pain Known history of GERD and Dysphagia. Last visit to review this was 08/2018, she had been doing better off of OTC antacid H2 blocker due to zantac recall. Previously saw [facility] in 2018, Dr Vicente Males, had EGD that was for dysphagia but did not identify any problem causing dysphagia, she was to keep on PPI daily for GERD at that time. - Now today she is reporting new worsening pain with upper abdominal epigastric region, described aching and burning pain, worse with eating and meals, occasional nausea and vomiting if too severe pain only. She has done been with some reduced intake. She says worse with spicy foods or trigger foods, but other foods can trigger it as well. - She switched to OTC Omeprazole 20mg  daily for past 3 months, without relief. Denies any fever chills sweats aches, headache, dark stool blood in stool, dyspnea  cough, chest pain, diarrhea  Past Surgical History:  Procedure Laterality Date  . ABDOMINAL HYSTERECTOMY  1998   partial  . BREAST CYST ASPIRATION Left 10/17/2014   FNA done by Dr. Jamal Collin  . BREAST EXCISIONAL BIOPSY Left 1998   x 2  . BREAST SURGERY Left 1998   removal of 2 fibrocystic masses  . COLONOSCOPY  2011  . COLONOSCOPY WITH PROPOFOL N/A 12/13/2016   Procedure: COLONOSCOPY WITH PROPOFOL;  Surgeon: Jonathon Bellows, MD;  Location: [facility] ENDOSCOPY;  Service: Endoscopy;  Laterality: N/A;  . ESOPHAGOGASTRODUODENOSCOPY (EGD) WITH PROPOFOL N/A 12/13/2016   Procedure: ESOPHAGOGASTRODUODENOSCOPY (EGD) WITH PROPOFOL;  Surgeon: Jonathon Bellows, MD;  Location: [facility] ENDOSCOPY;  Service: Endoscopy;  Laterality: N/A;  . TONSILLECTOMY  1996     Past Medical History:  Diagnosis Date  . Anemia   . GERD (gastroesophageal reflux disease)    Social History   Tobacco Use  . Smoking status: Never Smoker  . Smokeless tobacco: Never Used  Substance Use Topics  . Alcohol use: No    Alcohol/week: 0.0 standard drinks  . Drug use: No    Current Outpatient Medications:  .  aspirin EC 81 MG tablet, Take 81 mg by mouth daily., Disp: , Rfl:  .  pantoprazole (PROTONIX) 40 MG tablet, Take 1 tablet (40 mg total) by mouth daily before breakfast., Disp: 30 tablet, Rfl: 2 .  sucralfate (CARAFATE) 1 g tablet, Take 1 tablet (1 g total) by mouth 4 (four) times daily -  with meals and at bedtime. As needed, Disp: 60 tablet, Rfl: 1  Depression screen [facility] 2/9 12/02/2018 08/18/2018 04/02/2017  Decreased Interest 0 0  0  Down, Depressed, Hopeless 0 0 0  PHQ - 2 Score 0 0 0    No flowsheet data found.  -------------------------------------------------------------------------- O: No physical exam performed due to remote telephone encounter.  Lab results reviewed.  Last EGD Endoscopy report from 12/13/16 (for dysphagia) - Done by Dr Tobi BastosAnna AGI, showed 4 cm hiatal hernia, no cause of dysphagia, normal duodenum  No  results found for this or any previous visit (from the past 2160 hour(s)).  -------------------------------------------------------------------------- A&P:  Problem List Items Addressed This Visit    GERD (gastroesophageal reflux disease) - Primary   Relevant Medications   sucralfate (CARAFATE) 1 g tablet   pantoprazole (PROTONIX) 40 MG tablet    Other Visit Diagnoses    Epigastric pain       Relevant Medications   sucralfate (CARAFATE) 1 g tablet   pantoprazole (PROTONIX) 40 MG tablet     Suspect gastritis vs possible gastric ulcer likely secondary to uncontrolled GERD Prior EGD 2018 was unremarkable. Known dysphagia issue. Now seems predictable with meals triggering pain Concerning reduced appetite due to pain and occasional nausea vomiting. Able to tolerate PO and liquids.  Plan: 1. Stop OTC Omeprazole. Start prolonged PPI trial with Protonix 40mg  daily for 4 weeks, anticipate will need up to total 8 weeks  2. Start carafate 1g TID WC + QHS PRN for symptom relief 3. GERD diet recommendations, smaller frequent meals, portions, avoid triggers, maintain hydration 4. Strict return criteria given for worsening symptoms - if still not improving can reconsider other options of less likely causes such as gallbladder or pancreas - Considered H PYlori test but will not take patient off PPI at this time - May return to existing [facility] for consultation if still unresolved    Meds ordered this encounter  Medications  . sucralfate (CARAFATE) 1 g tablet    Sig: Take 1 tablet (1 g total) by mouth 4 (four) times daily -  with meals and at bedtime. As needed    Dispense:  60 tablet    Refill:  1  . pantoprazole (PROTONIX) 40 MG tablet    Sig: Take 1 tablet (40 mg total) by mouth daily before breakfast.    Dispense:  30 tablet    Refill:  2    Follow-up: - Return in 4-8 weeks if not improved GERD / Possible ulcer  Patient verbalizes understanding with the above medical  recommendations including the limitation of remote medical advice.  Specific follow-up and call-back criteria were given for patient to follow-up or seek medical care more urgently if needed.   - Time spent in direct consultation with patient on phone: 14 minutes  Saralyn PilarAlexander Damareon Lanni, DO [facility] 12/02/2018, 3:48 PM

## 2018-12-02 NOTE — Patient Instructions (Signed)
AVS info given on phone. No access to mychart.

## 2019-01-29 ENCOUNTER — Other Ambulatory Visit: Payer: Self-pay | Admitting: Family Medicine

## 2019-01-29 DIAGNOSIS — R1013 Epigastric pain: Secondary | ICD-10-CM

## 2019-01-29 DIAGNOSIS — K219 Gastro-esophageal reflux disease without esophagitis: Secondary | ICD-10-CM

## 2019-01-29 MED ORDER — PANTOPRAZOLE SODIUM 40 MG PO TBEC: 40.0000 mg | DELAYED_RELEASE_TABLET | Freq: Every day | ORAL | 2 refills | Status: DC

## 2019-01-29 NOTE — Telephone Encounter (Signed)
Pt called requesting refill on pantoprazole called into [facility]  Phillip Heal  [phone]

## 2019-03-12 ENCOUNTER — Ambulatory Visit (INDEPENDENT_AMBULATORY_CARE_PROVIDER_SITE_OTHER): Payer: 59

## 2019-03-12 ENCOUNTER — Other Ambulatory Visit: Payer: Self-pay

## 2019-03-12 DIAGNOSIS — Z23 Encounter for immunization: Secondary | ICD-10-CM | POA: Diagnosis not present

## 2019-04-20 ENCOUNTER — Ambulatory Visit: Payer: 59 | Admitting: Podiatry

## 2019-04-22 ENCOUNTER — Ambulatory Visit: Payer: 59 | Admitting: Podiatry

## 2019-06-16 ENCOUNTER — Ambulatory Visit: Payer: 59 | Admitting: Family Medicine

## 2019-12-19 IMAGING — MG DIGITAL SCREENING BILATERAL MAMMOGRAM WITH TOMO AND CAD
8 series · 8 of 24 positions shown · non-contrast
Comparison: Previous exam(s).

CLINICAL DATA: Screening.

EXAM:
DIGITAL SCREENING BILATERAL MAMMOGRAM WITH TOMO AND CAD

[R MLO synth-2D]
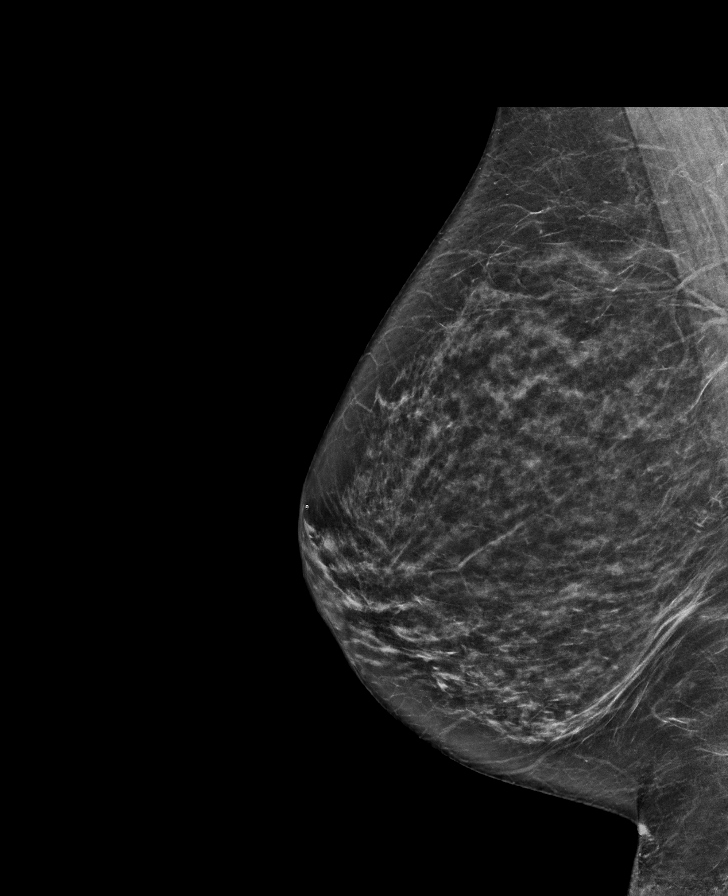

[L CC synth-2D]
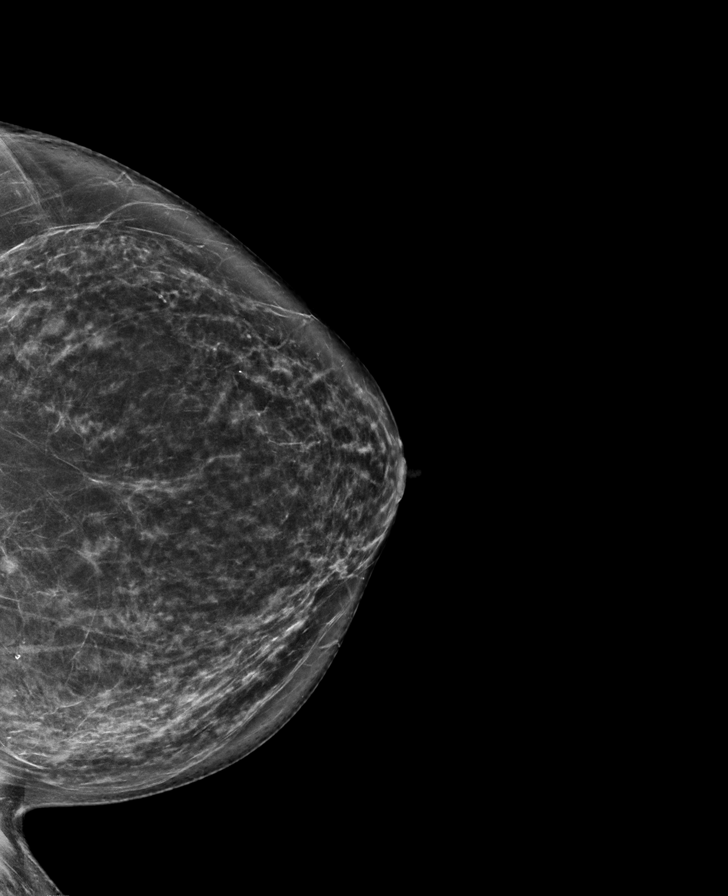

[L MLO synth-2D]
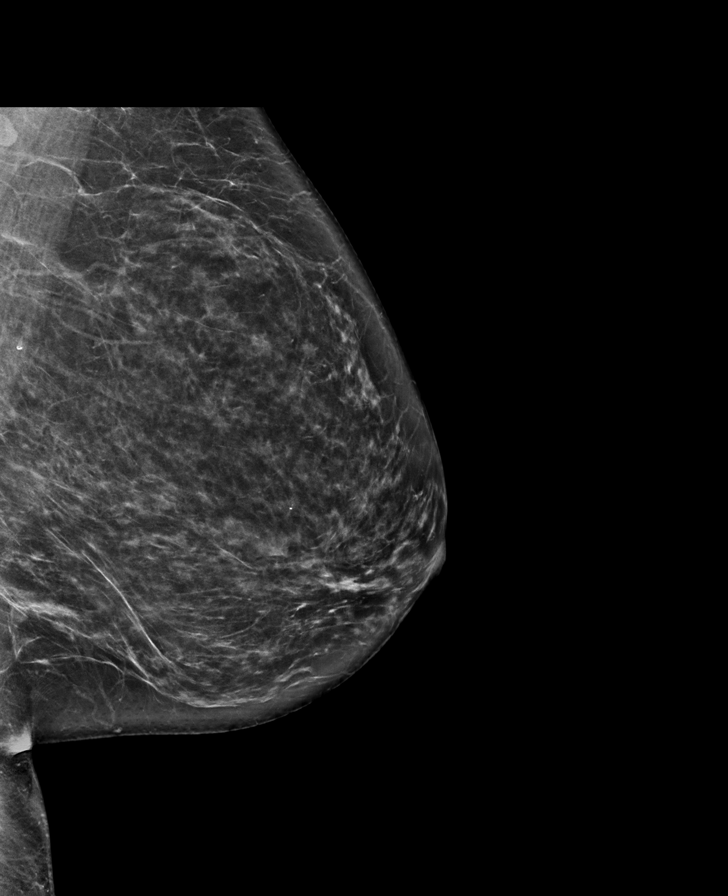

[R CC synth-2D]
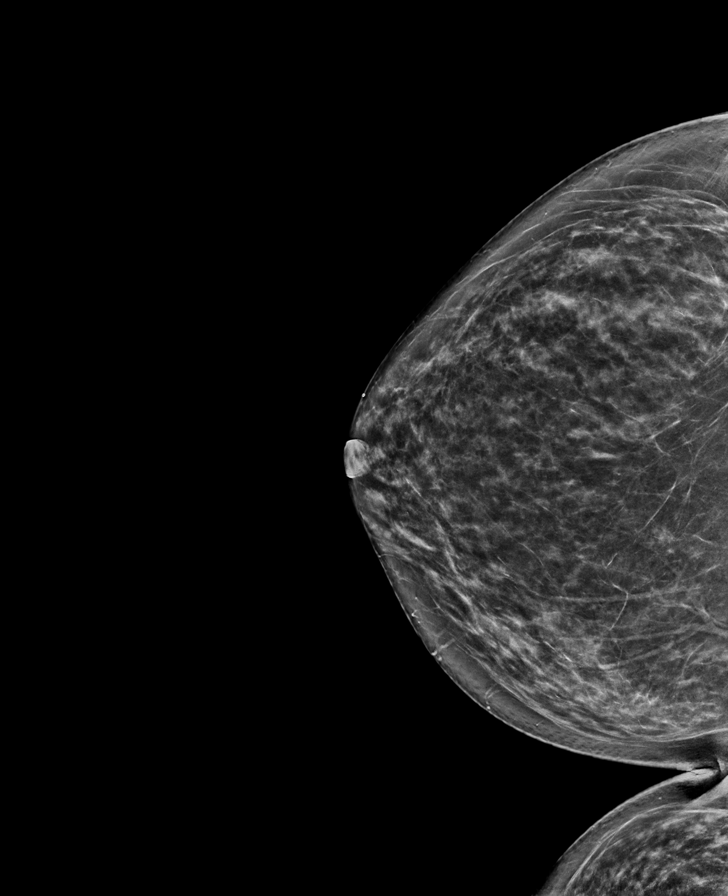

[R CC tomo · tomo slice 37/73.0]
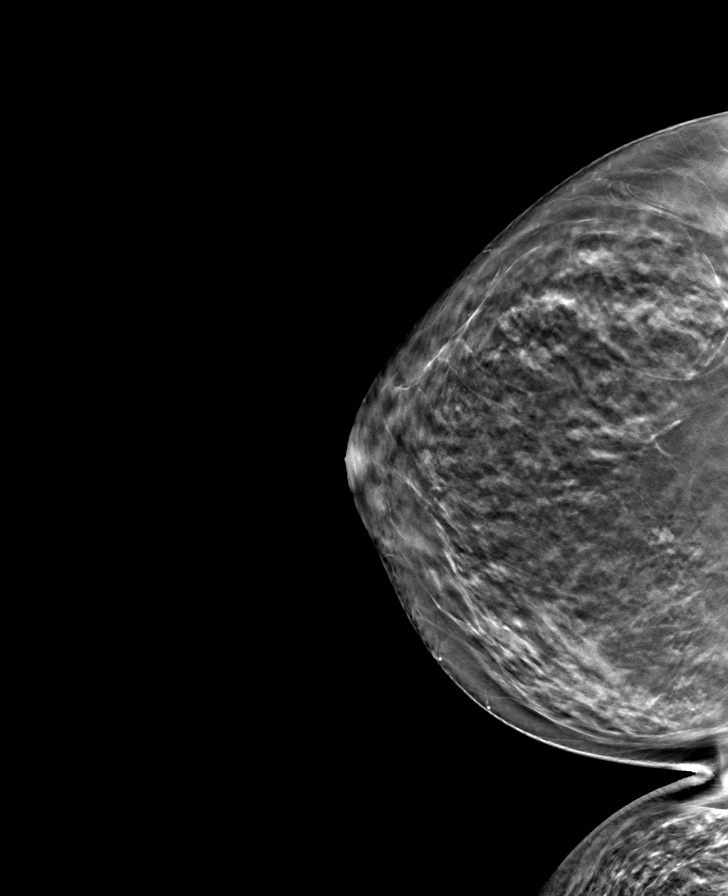

[L MLO tomo · tomo slice 39/76.0]
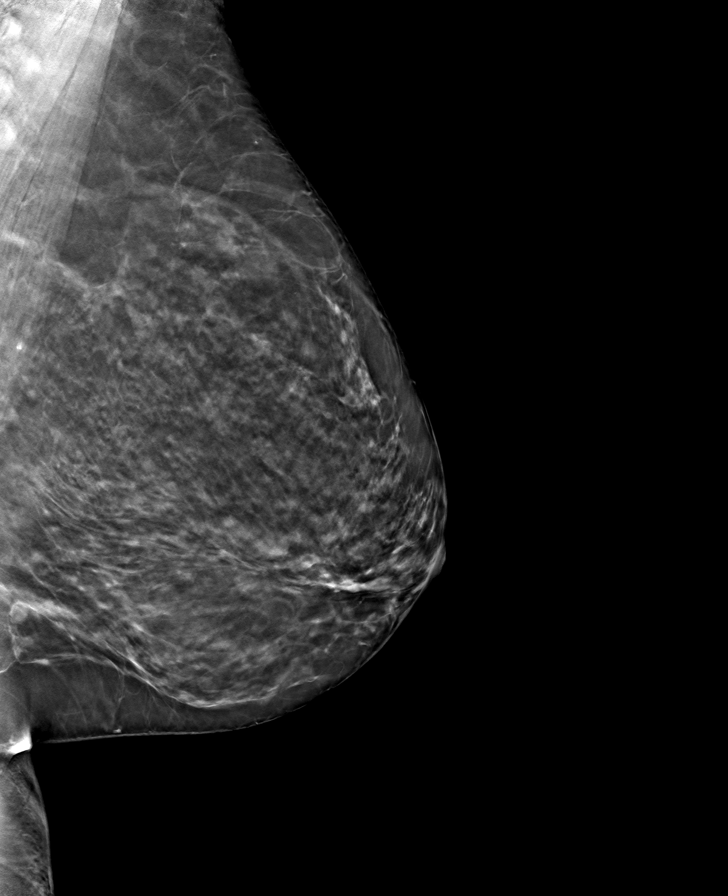

[L CC tomo · tomo slice 37/74.0]
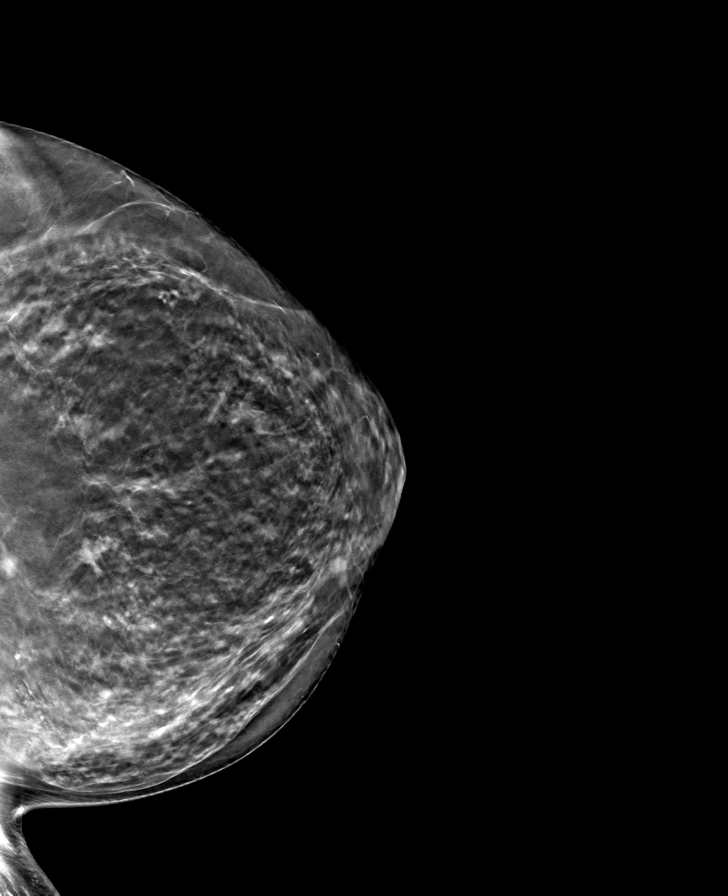

[R MLO tomo · tomo slice 36/71.0]
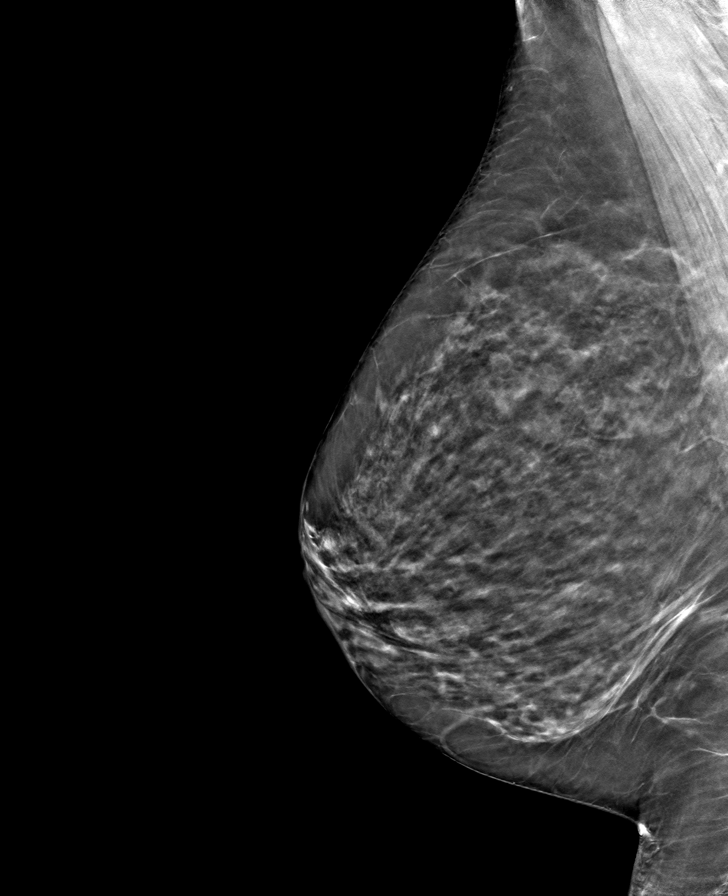

[8 of 24 positions shown; findings below may reference images not displayed]

ACR Breast Density Category c: The breast tissue is heterogeneously
dense, which may obscure small masses.
FINDINGS: There are no findings suspicious for malignancy. Images were
processed with CAD.
IMPRESSION: No mammographic evidence of malignancy. A result letter of this
screening mammogram will be mailed directly to the patient.

RECOMMENDATION:
Screening mammogram in one year. (Code:[id number])

BI-RADS CATEGORY  1: Negative.

## 2019-12-24 ENCOUNTER — Other Ambulatory Visit: Payer: Self-pay | Admitting: Family Medicine

## 2019-12-31 ENCOUNTER — Telehealth: Payer: Self-pay | Admitting: Family Medicine

## 2019-12-31 DIAGNOSIS — E559 Vitamin D deficiency, unspecified: Secondary | ICD-10-CM

## 2019-12-31 DIAGNOSIS — R7989 Other specified abnormal findings of blood chemistry: Secondary | ICD-10-CM

## 2019-12-31 DIAGNOSIS — Z0000 Encounter for general adult medical examination without abnormal findings: Secondary | ICD-10-CM

## 2019-12-31 DIAGNOSIS — E7800 Pure hypercholesterolemia, unspecified: Secondary | ICD-10-CM

## 2019-12-31 NOTE — Telephone Encounter (Signed)
Added TSH Free T4 Vit D  Signed  Saralyn Pilar, DO [facility] 12/31/2019, 6:38 PM

## 2020-01-03 ENCOUNTER — Other Ambulatory Visit: Payer: Self-pay

## 2020-01-03 ENCOUNTER — Other Ambulatory Visit: Payer: 59

## 2020-01-03 DIAGNOSIS — E559 Vitamin D deficiency, unspecified: Secondary | ICD-10-CM

## 2020-01-03 DIAGNOSIS — Z0000 Encounter for general adult medical examination without abnormal findings: Secondary | ICD-10-CM

## 2020-01-03 DIAGNOSIS — R7989 Other specified abnormal findings of blood chemistry: Secondary | ICD-10-CM

## 2020-01-03 DIAGNOSIS — E7800 Pure hypercholesterolemia, unspecified: Secondary | ICD-10-CM

## 2020-01-04 ENCOUNTER — Other Ambulatory Visit: Payer: 59

## 2020-01-04 LAB — COMPLETE METABOLIC PANEL WITH GFR
AG Ratio: 1.9 (calc) (ref 1.0–2.5)
ALT: 12 U/L (ref 6–29)
AST: 11 U/L (ref 10–35)
Albumin: 4.2 g/dL (ref 3.6–5.1)
Alkaline phosphatase (APISO): 90 U/L (ref 37–153)
BUN: 7 mg/dL (ref 7–25)
CO2: 29 mmol/L (ref 20–32)
Calcium: 10.6 mg/dL — ABNORMAL HIGH (ref 8.6–10.4)
Chloride: 110 mmol/L (ref 98–110)
Creat: 0.52 mg/dL (ref 0.50–0.99)
GFR, Est African American: 115 mL/min/{1.73_m2} (ref >=60)
GFR, Est Non African American: 99 mL/min/{1.73_m2} (ref >=60)
Globulin: 2.2 g/dL (calc) (ref 1.9–3.7)
Glucose, Bld: 86 mg/dL (ref 65–99)
Potassium: 4.2 mmol/L (ref 3.5–5.3)
Sodium: 144 mmol/L (ref 135–146)
Total Bilirubin: 0.4 mg/dL (ref 0.2–1.2)
Total Protein: 6.4 g/dL (ref 6.1–8.1)

## 2020-01-04 LAB — CBC WITH DIFFERENTIAL/PLATELET
Absolute Monocytes: 240 cells/uL (ref 200–950)
Basophils Absolute: 40 cells/uL (ref 0–200)
Basophils Relative: 0.8 %
Eosinophils Absolute: 130 cells/uL (ref 15–500)
Eosinophils Relative: 2.6 %
HCT: 35.9 % (ref 35.0–45.0)
Hemoglobin: 11.7 g/dL (ref 11.7–15.5)
Lymphs Abs: 1835 cells/uL (ref 850–3900)
MCH: 27.1 pg (ref 27.0–33.0)
MCHC: 32.6 g/dL (ref 32.0–36.0)
MCV: 83.3 fL (ref 80.0–100.0)
MPV: 10.6 fL (ref 7.5–12.5)
Monocytes Relative: 4.8 %
Neutro Abs: 2755 cells/uL (ref 1500–7800)
Neutrophils Relative %: 55.1 %
Platelets: 258 10*3/uL (ref 140–400)
RBC: 4.31 10*6/uL (ref 3.80–5.10)
RDW: 14.1 % (ref 11.0–15.0)
Total Lymphocyte: 36.7 %
WBC: 5 10*3/uL (ref 3.8–10.8)

## 2020-01-04 LAB — VITAMIN D 25 HYDROXY (VIT D DEFICIENCY, FRACTURES): Vit D, 25-Hydroxy: 11 ng/mL — ABNORMAL LOW (ref 30–100)

## 2020-01-04 LAB — LIPID PANEL
Cholesterol: 200 mg/dL — ABNORMAL HIGH (ref <200)
HDL: 56 mg/dL (ref >=50)
LDL Cholesterol (Calc): 124 mg/dL (calc) — ABNORMAL HIGH
Non-HDL Cholesterol (Calc): 144 mg/dL (calc) — ABNORMAL HIGH (ref <130)
Total CHOL/HDL Ratio: 3.6 (calc) (ref <5.0)
Triglycerides: 96 mg/dL (ref <150)

## 2020-01-04 LAB — HEMOGLOBIN A1C
Hgb A1c MFr Bld: 5.4 % of total Hgb (ref <5.7)
Mean Plasma Glucose: 108 (calc)
eAG (mmol/L): 6 (calc)

## 2020-01-04 LAB — TSH: TSH: 3.86 mIU/L (ref 0.40–4.50)

## 2020-01-04 LAB — T4, FREE: Free T4: 1 ng/dL (ref 0.8–1.8)

## 2020-01-07 ENCOUNTER — Ambulatory Visit (INDEPENDENT_AMBULATORY_CARE_PROVIDER_SITE_OTHER): Payer: 59 | Admitting: Family Medicine

## 2020-01-07 ENCOUNTER — Encounter: Payer: Self-pay | Admitting: Family Medicine

## 2020-01-07 ENCOUNTER — Other Ambulatory Visit: Payer: Self-pay

## 2020-01-07 VITALS — BP 125/82 | HR 78 | Temp 97.3°F | Resp 16 | Ht 67.0 in | Wt 157.6 lb

## 2020-01-07 DIAGNOSIS — K219 Gastro-esophageal reflux disease without esophagitis: Secondary | ICD-10-CM

## 2020-01-07 DIAGNOSIS — E559 Vitamin D deficiency, unspecified: Secondary | ICD-10-CM | POA: Diagnosis not present

## 2020-01-07 DIAGNOSIS — Z1231 Encounter for screening mammogram for malignant neoplasm of breast: Secondary | ICD-10-CM

## 2020-01-07 DIAGNOSIS — Z87898 Personal history of other specified conditions: Secondary | ICD-10-CM

## 2020-01-07 DIAGNOSIS — E7800 Pure hypercholesterolemia, unspecified: Secondary | ICD-10-CM | POA: Diagnosis not present

## 2020-01-07 DIAGNOSIS — R1013 Epigastric pain: Secondary | ICD-10-CM

## 2020-01-07 DIAGNOSIS — Z0000 Encounter for general adult medical examination without abnormal findings: Secondary | ICD-10-CM | POA: Diagnosis not present

## 2020-01-07 MED ORDER — VITAMIN D3 125 MCG (5000 UT) PO CAPS: 5000.0000 [IU] | ORAL_CAPSULE | Freq: Every day | ORAL | 0 refills | Status: DC

## 2020-01-07 NOTE — Patient Instructions (Signed)
Thank you for coming to the office today.  Low Vitamin D  Start OTC Vitamin D3 5,000 iu daily for 12 weeks then reduce to OTC Vitamin D3 2,000 iu daily for maintenance  Let me know if need re order on stomach acid medicine.  For Mammogram screening for breast cancer   Call the Imaging Center below anytime to schedule your own appointment now that order has been placed.  [facility] [address] [place], [place] [address] Phone: [phone]    DUE for FASTING BLOOD WORK (no food or drink after midnight before the lab appointment, only water or coffee without cream/sugar on the morning of)  SCHEDULE "Lab Only" visit in the morning at the clinic for lab draw in 1 YEAR  - Make sure Lab Only appointment is at about 1 week before your next appointment, so that results will be available  For Lab Results, once available within 2-3 days of blood draw, you can can log in to MyChart online to view your results and a brief explanation. Also, we can discuss results at next follow-up visit.    Please schedule a Follow-up Appointment to: Return in about 1 year (around 01/06/2021) for Annual Physical.  If you have any other questions or concerns, please feel free to call the office or send a message through MyChart. You may also schedule an earlier appointment if necessary.  Additionally, you may be receiving a survey about your experience at our office within a few days to 1 week by e-mail or mail. We value your feedback.  Saralyn Pilar, DO [facility], [place]

## 2020-01-07 NOTE — Progress Notes (Signed)
Subjective:    Patient ID: Sophia Schmidt, female    DOB: 08/12/1950, 69 y.o.   MRN: [id number]  Sophia HoughBarbara J Schmidt is a 69 y.o. female presenting on 01/07/2020 for Annual Exam   HPI  Here for Annual Physical and Lab Review   Vitamin D Deficiency Past Vitamin D was low at result 13 - trial on vitamin D since off now. Re-check due  HYPERLIPIDEMIA: - Reports no concerns. Last lipid panel 12/2019,mostlycontrolled mild elevated LDL 124 - Not taking a Statin or cholesterol medication - She is taking ASA 81mg  daily some missed doses Lifestyle - Weight down 4+ lbs - Diet:improved diet, balanced - Exercise:walking  FOLLOW-UP GERD Prior history of GERD and Dysphagia. History of EGD Colonoscopy, see prior note - Currently doing well, taking Pantoprazole 40mg  QOD   Health Maintenance:  Colon CA Screening: Last Colonoscopy (done by Dr Tobi BastosAnna AGI) on 12/13/16, results with no polyps, good for 5 years. Currently asymptomatic. No known family history of colon CA. UTD - next due 11/2021  -Breast CA Screening: Due for mammogram screening. [facility] - history of prior had birads 2 L breast 8 oclock nodule - had US negative. And history of prior abnormal results withabnormal mass/nodule with fibrocystic masses s/p lumpectomy L 1998.Known family history of breast cancersister age 68. Currently asymptomatic.  - Due for DEXA BMD - no known family or personal history of osteoporosis. No prior fragility fractures. No prior Vitamin D result. DECLINED  [facility] 01/07/2020 12/02/2018 08/18/2018  Decreased Interest 0 0 0  Down, Depressed, Hopeless 0 0 0  PHQ - 2 Score 0 0 0    Past Medical History:  Diagnosis Date  . Anemia   . GERD (gastroesophageal reflux disease)    Past Surgical History:  Procedure Laterality Date  . ABDOMINAL HYSTERECTOMY  1998   partial  . BREAST CYST ASPIRATION Left 10/17/2014   FNA done by Dr. Evette CristalSankar  . BREAST EXCISIONAL  BIOPSY Left 1998   x 2  . BREAST SURGERY Left 1998   removal of 2 fibrocystic masses  . COLONOSCOPY  2011  . COLONOSCOPY WITH PROPOFOL N/A 12/13/2016   Procedure: COLONOSCOPY WITH PROPOFOL;  Surgeon: Wyline MoodAnna, Kiran, MD;  [facility];  Service: Endoscopy;  Laterality: N/A;  . ESOPHAGOGASTRODUODENOSCOPY (EGD) WITH PROPOFOL N/A 12/13/2016   Procedure: ESOPHAGOGASTRODUODENOSCOPY (EGD) WITH PROPOFOL;  Surgeon: Wyline MoodAnna, Kiran, MD;  Location[facility] END[facility]COPY;  Service: Endoscopy;  Laterality: N/A;  . TONSILLECTOMY  1996   Social History   Socioeconomic History  . Marital status: Divorced    Spouse name: Not on file  . Number of children: Not on file  . Years of education: Not on file  . Highest education level: Not on file  Occupational History  . Not on file  Tobacco Use  . Smoking status: Never Smoker  . Smokeless tobacco: Never Used  Vaping Use  . Vaping Use: Never used  Substance and Sexual Activity  . Alcohol use: No    Alcohol/week: 0.0 standard drinks  . Drug use: No  . Sexual activity: Yes  Other Topics Concern  . Not on file  Social History Narrative  . Not on file   Social Determinants of Health   Financial Resource Strain:   . Difficulty of Paying Living Expenses:   Food Insecurity:   . Worried About Programme researcher, broadcasting/film/videounning Out of Food in the Last Year:   . [employer] Out of Food in the Last Year:  Transportation Needs:   . Freight forwarder (Medical):   Marland Kitchen Lack of Transportation (Non-Medical):   Physical Activity:   . Days of Exercise per Week:   . Minutes of Exercise per Session:   Stress:   . Feeling of Stress :   Social Connections:   . Frequency of Communication with Friends and Family:   . Frequency of Social Gatherings with Friends and Family:   . Attends Religious Services:   . Active Member of Clubs or Organizations:   . Attends Banker Meetings:   Marland Kitchen Marital Status:   Intimate Partner Violence:   . Fear of Current or Ex-Partner:   . Emotionally  Abused:   Marland Kitchen Physically Abused:   . Sexually Abused:    Family History  Problem Relation Age of Onset  . Cancer Mother        pancreatic  . Alzheimer's disease Father   . Cancer Sister        lung  . Cancer Brother        lung  . Breast cancer Sister        59  . Colon cancer Neg Hx    Current Outpatient Medications on File Prior to Visit  Medication Sig  . aspirin EC 81 MG tablet Take 81 mg by mouth daily.  . pantoprazole (PROTONIX) 40 MG tablet Take 1 tablet (40 mg total) by mouth every other day.   No current facility-administered medications on file prior to visit.    Review of Systems  Constitutional: Negative for activity change, appetite change, chills, diaphoresis, fatigue and fever.  HENT: Negative for congestion and hearing loss.   Eyes: Negative for visual disturbance.  Respiratory: Negative for apnea, cough, chest tightness, shortness of breath and wheezing.   Cardiovascular: Negative for chest pain, palpitations and leg swelling.  Gastrointestinal: Negative for abdominal pain, anal bleeding, blood in stool, constipation, diarrhea, nausea and vomiting.  Endocrine: Negative for cold intolerance.  Genitourinary: Negative for difficulty urinating, dysuria, frequency and hematuria.  Musculoskeletal: Negative for arthralgias and neck pain.  Skin: Negative for rash.  Allergic/Immunologic: Negative for environmental allergies.  Neurological: Negative for dizziness, weakness, light-headedness, numbness and headaches.  Hematological: Negative for adenopathy.  Psychiatric/Behavioral: Negative for behavioral problems, dysphoric mood and sleep disturbance. The patient is not nervous/anxious.    Per HPI unless specifically indicated above     Objective:    BP 125/82   Pulse 78   Temp (!) 97.3 F (36.3 C) (Temporal)   Resp 16   Ht 5\' 7"  (1.702 m)   Wt 157 lb 9.6 oz (71.5 kg)   SpO2 100%   BMI 24.68 kg/m   Wt Readings from Last 3 Encounters:  01/07/20 157 lb 9.6  oz (71.5 kg)  08/18/18 160 lb 12.8 oz (72.9 kg)  04/02/17 158 lb (71.7 kg)    Physical Exam Vitals and nursing note reviewed.  Constitutional:      General: She is not in acute distress.    Appearance: She is well-developed. She is not diaphoretic.     Comments: Well-appearing, comfortable, cooperative  HENT:     Head: Normocephalic and atraumatic.  Eyes:     General:        Right eye: No discharge.        Left eye: No discharge.     Conjunctiva/sclera: Conjunctivae normal.     Pupils: Pupils are equal, round, and reactive to light.  Neck:     Thyroid: No thyromegaly.  Vascular: No carotid bruit.  Cardiovascular:     Rate and Rhythm: Normal rate and regular rhythm.     Heart sounds: Normal heart sounds. No murmur heard.   Pulmonary:     Effort: Pulmonary effort is normal. No respiratory distress.     Breath sounds: Normal breath sounds. No wheezing or rales.  Abdominal:     General: Bowel sounds are normal. There is no distension.     Palpations: Abdomen is soft. There is no mass.     Tenderness: There is no abdominal tenderness.  Musculoskeletal:        General: No tenderness. Normal range of motion.     Cervical back: Normal range of motion and neck supple.     Right lower leg: No edema.     Left lower leg: No edema.     Comments: Upper / Lower Extremities: - Normal muscle tone, strength bilateral upper extremities 5/5, lower extremities 5/5  Lymphadenopathy:     Cervical: No cervical adenopathy.  Skin:    General: Skin is warm and dry.     Findings: No erythema or rash.  Neurological:     Mental Status: She is alert and oriented to person, place, and time.     Comments: Distal sensation intact to light touch all extremities  Psychiatric:        Behavior: Behavior normal.     Comments: Well groomed, good eye contact, normal speech and thoughts        Results for orders placed or performed in visit on 01/03/20  T4, free  Result Value Ref Range   Free T4  1.0 0.8 - 1.8 ng/dL  TSH  Result Value Ref Range   TSH 3.86 0.40 - 4.50 mIU/L  VITAMIN D 25 Hydroxy (Vit-D Deficiency, Fractures)  Result Value Ref Range   Vit D, 25-Hydroxy 11 (L) 30 - 100 ng/mL  CBC with Differential/Platelet  Result Value Ref Range   WBC 5.0 3.8 - 10.8 Thousand/uL   RBC 4.31 3.80 - 5.10 Million/uL   Hemoglobin 11.7 11.7 - 15.5 g/dL   HCT 32.4 35 - 45 %   MCV 83.3 80.0 - 100.0 fL   MCH 27.1 27.0 - 33.0 pg   MCHC 32.6 32.0 - 36.0 g/dL   RDW 40.1 02.7 - 25.3 %   Platelets 258 140 - 400 Thousand/uL   MPV 10.6 7.5 - 12.5 fL   Neutro Abs 2,755 1,500 - 7,800 cells/uL   Lymphs Abs 1,835 850 - 3,900 cells/uL   Absolute Monocytes 240 200 - 950 cells/uL   Eosinophils Absolute 130 15 - 500 cells/uL   Basophils Absolute 40 0 - 200 cells/uL   Neutrophils Relative % 55.1 %   Total Lymphocyte 36.7 %   Monocytes Relative 4.8 %   Eosinophils Relative 2.6 %   Basophils Relative 0.8 %  Lipid panel  Result Value Ref Range   Cholesterol 200 (H) <200 mg/dL   HDL 56 > OR = 50 mg/dL   Triglycerides 96 <664 mg/dL   LDL Cholesterol (Calc) 124 (H) mg/dL (calc)   Total CHOL/HDL Ratio 3.6 <5.0 (calc)   Non-HDL Cholesterol (Calc) 144 (H) <130 mg/dL (calc)  Hemoglobin Q0H  Result Value Ref Range   Hgb A1c MFr Bld 5.4 <5.7 % of total Hgb   Mean Plasma Glucose 108 (calc)   eAG (mmol/L) 6.0 (calc)  COMPLETE METABOLIC PANEL WITH GFR  Result Value Ref Range   Glucose, Bld 86 65 - 99 mg/dL  BUN 7 7 - 25 mg/dL   Creat 3.01 6.01 - 0.93 mg/dL   GFR, Est Non African American 99 > OR = 60 mL/min/1.56m2   GFR, Est African American 115 > OR = 60 mL/min/1.103m2   BUN/Creatinine Ratio NOT APPLICABLE 6 - 22 (calc)   Sodium 144 135 - 146 mmol/L   Potassium 4.2 3.5 - 5.3 mmol/L   Chloride 110 98 - 110 mmol/L   CO2 29 20 - 32 mmol/L   Calcium 10.6 (H) 8.6 - 10.4 mg/dL   Total Protein 6.4 6.1 - 8.1 g/dL   Albumin 4.2 3.6 - 5.1 g/dL   Globulin 2.2 1.9 - 3.7 g/dL (calc)   AG Ratio 1.9 1.0 -  2.5 (calc)   Total Bilirubin 0.4 0.2 - 1.2 mg/dL   Alkaline phosphatase (APISO) 90 37 - 153 U/L   AST 11 10 - 35 U/L   ALT 12 6 - 29 U/L      Assessment & Plan:   Problem List Items Addressed This Visit    Vitamin D deficiency   Relevant Medications   Cholecalciferol (VITAMIN D3) 125 MCG (5000 UT) CAPS   Pure hypercholesterolemia    Mostly Controlled cholesterol but elevated LDL 124 Last lipid panel 7.2021  Plan: 1. Continue ASA 81mg  for primary ASCVD risk reduction 2. Encourage improved lifestyle - low carb/cholesterol, reduce portion size, continue improving regular exercise      GERD (gastroesophageal reflux disease)    Stable, seems more controlled - Prior history complicated by dysphagia vs esophageal motility disorder - S/p AGI EGD 11/2016, see report  Plan: 1. Controlled on Pantoprazole 40 QOD 2. Follow-up with GI as planned if dysphagia worsens, as mentioned may need esophageal manometry or other studies      Relevant Medications   pantoprazole (PROTONIX) 40 MG tablet   Breast cancer screening    Asymptomatic Fam history of breast cancer Prior personal history of abnormal nodule vs cyst  [facility]amm[facility] screen [facility] now as she is due      Relevant Orders   MM 3D SCREEN BREAST BILATERAL    Other Visit Diagnoses    Annual physical exam    -  Primary   History of fibrocystic disease of breast       Relevant Orders   MM 3D SCREEN BREAST BILATERAL   Epigastric pain       Relevant Medications   pantoprazole (PROTONIX) 40 MG tablet      Updated Health Maintenance information - Ordered mammogram Reviewed recent lab results with patient Encouraged improvement to lifestyle with diet and exercise - Goal of weight loss   Meds ordered this encounter  Medications  . Cholecalciferol (VITAMIN D3) 125 MCG (5000 UT) CAPS    Sig: Take 1 capsule (5,000 Units total) by mouth daily. For 12 weeks, then start Vitamin D3 2,000 units daily (OTC)    Dispense:   90 capsule    Refill:  0    Follow up plan: Return in about 1 year (around 01/06/2021) for Annual Physical.  Will need to order future labs.  01/08/2021, DO [facility] 01/07/2020, 3:49 PM

## 2020-01-10 NOTE — Assessment & Plan Note (Signed)
Asymptomatic Fam history of breast cancer Prior personal history of abnormal nodule vs cyst  Order Mammogram screen at [facility] now as she is due

## 2020-01-10 NOTE — Assessment & Plan Note (Signed)
Mostly Controlled cholesterol but elevated LDL 124 Last lipid panel 7.2021  Plan: 1. Continue ASA 81mg  for primary ASCVD risk reduction 2. Encourage improved lifestyle - low carb/cholesterol, reduce portion size, continue improving regular exercise

## 2020-01-10 NOTE — Assessment & Plan Note (Signed)
Stable, seems more controlled - Prior history complicated by dysphagia vs esophageal motility disorder - S/p AGI EGD 11/2016, see report  Plan: 1. Controlled on Pantoprazole 40 QOD 2. Follow-up with GI as planned if dysphagia worsens, as mentioned may need esophageal manometry or other studies

## 2020-02-17 ENCOUNTER — Other Ambulatory Visit: Payer: Self-pay

## 2020-02-17 ENCOUNTER — Ambulatory Visit
Admission: RE | Admit: 2020-02-17 | Discharge: 2020-02-17 | Disposition: A | Discharge status: Home / Self Care | Payer: No Typology Code available for payment source | Source: Ambulatory Visit | Attending: Family Medicine | Admitting: Family Medicine

## 2020-02-17 DIAGNOSIS — Z87898 Personal history of other specified conditions: Secondary | ICD-10-CM | POA: Diagnosis present

## 2020-02-17 DIAGNOSIS — Z1231 Encounter for screening mammogram for malignant neoplasm of breast: Secondary | ICD-10-CM | POA: Diagnosis not present

## 2020-02-22 ENCOUNTER — Other Ambulatory Visit: Payer: Self-pay | Admitting: Family Medicine

## 2020-02-22 DIAGNOSIS — N6310 Unspecified lump in the right breast, unspecified quadrant: Secondary | ICD-10-CM

## 2020-02-22 DIAGNOSIS — R928 Other abnormal and inconclusive findings on diagnostic imaging of breast: Secondary | ICD-10-CM

## 2020-02-22 DIAGNOSIS — N6320 Unspecified lump in the left breast, unspecified quadrant: Secondary | ICD-10-CM

## 2020-03-14 ENCOUNTER — Other Ambulatory Visit: Payer: Self-pay

## 2020-03-14 ENCOUNTER — Ambulatory Visit
Admission: RE | Admit: 2020-03-14 | Discharge: 2020-03-14 | Disposition: A | Discharge status: Home / Self Care | Payer: No Typology Code available for payment source | Source: Ambulatory Visit | Attending: Family Medicine | Admitting: Family Medicine

## 2020-03-14 DIAGNOSIS — N6320 Unspecified lump in the left breast, unspecified quadrant: Secondary | ICD-10-CM

## 2020-03-14 DIAGNOSIS — R928 Other abnormal and inconclusive findings on diagnostic imaging of breast: Secondary | ICD-10-CM | POA: Diagnosis present

## 2020-03-14 DIAGNOSIS — N6310 Unspecified lump in the right breast, unspecified quadrant: Secondary | ICD-10-CM | POA: Diagnosis present

## 2020-03-29 ENCOUNTER — Other Ambulatory Visit: Payer: Self-pay

## 2020-03-29 ENCOUNTER — Ambulatory Visit (INDEPENDENT_AMBULATORY_CARE_PROVIDER_SITE_OTHER): Payer: 59

## 2020-03-29 DIAGNOSIS — Z23 Encounter for immunization: Secondary | ICD-10-CM | POA: Diagnosis not present

## 2020-08-11 ENCOUNTER — Ambulatory Visit: Payer: Self-pay | Admitting: *Deleted

## 2020-08-11 NOTE — Telephone Encounter (Signed)
Pt called with complaints of "flu"; her symptoms started on 08/08/20; she is having vomiting, headache, and non-productive cough  the pt says she is also having intermittent fever, body aches, and chills; she has vomited once each morning since 08/08/20; in the past 24 hours; she also has a ; recommendations made per nurse triage protocol; she verbalized understanding and says she will go to Urgent Care; the pt is seen by Dr Vear Clock, [facility]; will route to office for notification.   Reason for Disposition . [1] Drinking very little AND [2] dehydration suspected (e.g., no urine > 12 hours, very dry mouth, very lightheaded)  Answer Assessment - Initial Assessment Questions 1. VOMITING SEVERITY: "How many times have you vomited in the past 24 hours?"     - MILD:  1 - 2 times/day    - MODERATE: 3 - 5 times/day, decreased oral intake without significant weight loss or symptoms of dehydration    - SEVERE: 6 or more times/day, vomits everything or nearly everything, with significant weight loss, symptoms of dehydration      Mild once daily 2. ONSET: "When did the vomiting begin?"     08/07/20 3. FLUIDS: "What fluids or food have you vomited up today?" "Have you been able to keep any fluids down?"     Able to tolerate gatorade (28 oz bottle x 2 daily) 4. ABDOMINAL PAIN: "Are your having any abdominal pain?" If yes : "How bad is it and what does it feel like?" (e.g., crampy, dull, intermittent, constant)     no 5. DIARRHEA: "Is there any diarrhea?" If Yes, ask: "How many times today?"     no 6. CONTACTS: "Is there anyone else in the family with the same symptoms?"      no 7. CAUSE: "What do you think is causing your vomiting?"    Not sure 8. HYDRATION STATUS: "Any signs of dehydration?" (e.g., dry mouth [not only dry lips], too weak to stand) "When did you last urinate?"    Last urinated at 0730; denies weakness 9. OTHER SYMPTOMS: "Do you have any other symptoms?" (e.g., fever,  headache, vertigo, vomiting blood or coffee grounds, recent head injury)    Headache, intermittent fever, non-productive cough, and bodyaches 10. PREGNANCY: "Is there any chance you are pregnant?" "When was your last menstrual period?"       n/a  Protocols used: [facility]

## 2020-08-12 DIAGNOSIS — J189 Pneumonia, unspecified organism: Secondary | ICD-10-CM | POA: Insufficient documentation

## 2020-08-24 ENCOUNTER — Telehealth: Payer: Self-pay | Admitting: Family Medicine

## 2020-08-24 NOTE — Telephone Encounter (Signed)
Called to request verbal orders for PT - 1xwk 1, 2xwk 1, 1xwk 2.  If there are any questions, please call at [phone]

## 2020-08-24 NOTE — Telephone Encounter (Signed)
Ok to proceed with verbal orders for PT  Saralyn Pilar, DO [facility] 08/24/2020, 2:39 PM

## 2020-08-25 NOTE — Telephone Encounter (Signed)
Attempted to contact Caesar from [facility]. No answer. LMOM to return my call.

## 2020-08-25 NOTE — Telephone Encounter (Signed)
Verbal given to Caesar at [facility].

## 2020-09-04 ENCOUNTER — Other Ambulatory Visit: Payer: Self-pay

## 2020-09-04 ENCOUNTER — Encounter: Payer: Self-pay | Admitting: Family Medicine

## 2020-09-04 ENCOUNTER — Ambulatory Visit (INDEPENDENT_AMBULATORY_CARE_PROVIDER_SITE_OTHER): Payer: 59 | Admitting: Family Medicine

## 2020-09-04 ENCOUNTER — Telehealth: Payer: Self-pay

## 2020-09-04 VITALS — BP 130/64 | HR 79 | Ht 65.0 in | Wt 148.6 lb

## 2020-09-04 DIAGNOSIS — R110 Nausea: Secondary | ICD-10-CM | POA: Diagnosis not present

## 2020-09-04 DIAGNOSIS — J479 Bronchiectasis, uncomplicated: Secondary | ICD-10-CM | POA: Diagnosis not present

## 2020-09-04 DIAGNOSIS — J189 Pneumonia, unspecified organism: Secondary | ICD-10-CM | POA: Diagnosis not present

## 2020-09-04 DIAGNOSIS — R531 Weakness: Secondary | ICD-10-CM | POA: Diagnosis not present

## 2020-09-04 MED ORDER — ONDANSETRON 4 MG PO TBDP: 4.0000 mg | ORAL_TABLET | Freq: Three times a day (TID) | ORAL | 1 refills | Status: DC | PRN

## 2020-09-04 NOTE — Progress Notes (Signed)
Subjective:    Patient ID: Sophia Schmidt, female    DOB: 15-Dec-1950, 70 y.o.   MRN: [id number]  Sophia Schmidt is a 70 y.o. female presenting on 09/04/2020 for Hospitalization Follow-up and Pneumonia   HPI  HOSPITAL FOLLOW-UP VISIT  Hospital/Location: [facility] Date of Admission: 08/12/20 Date of Discharge: 08/21/20 Transitions of care telephone call: Completed by Phillips Grout RN [facility] on 08/28/20  Reason for Admission: Pneumonia Primary (+Secondary) Diagnosis: Multifocal Pneumonia  - Hospital H&P and Discharge Summary have been reviewed - Patient presents today 14 days after recent hospitalization. Brief summary of recent course, patient had symptoms of dyspnea, fever, fatigue, hospitalized for pneumonia, multifocal on imaging, treated with multiple rounds of antibiotics, on oxygen therapy, taken off oxygen on discharge to home. During hospital stay she was transferred to ICU at one point and blood cultures were done. No other clear cause. She was having limited improvement and they switch to better gram negative coverage and started on Cefepime. Seen by Pulmonology in hospital, weaned off prior to discharge off oxygen, completed 7 days of cefepime for complicated pneumonia.   Additionally had RUQ abdominal pain and elevated LFTs, there was evidence on MRCP of dilated common bile duct.  She was discharged with home health services.  - Today reports overall has done fairly well after discharge. Symptoms of dyspnea have improved but not back to baseline. Feels breathing only 50% or less. No longer needing oxygen.  Anemia chronic problem. Poor PO intake at times due to reduced appetite and nausea  Patient was working prior to this illness, and will need Short Term Disability paper work while she recovers  daughter needs FMLA as caregiver    I have reviewed the discharge medication list, and have reconciled the current and discharge medications today.   Current  Outpatient Medications:  .  aspirin EC 81 MG tablet, Take 81 mg by mouth daily., Disp: , Rfl:  .  Cholecalciferol (VITAMIN D3) 125 MCG (5000 UT) CAPS, Take 1 capsule (5,000 Units total) by mouth daily. For 12 weeks, then start Vitamin D3 2,000 units daily (OTC), Disp: 90 capsule, Rfl: 0 .  ferrous sulfate 325 (65 FE) MG tablet, Take 1 tablet by mouth daily., Disp: , Rfl:  .  guaifenesin (ROBITUSSIN) 100 MG/5ML syrup, Take 100 mg by mouth 3 (three) times daily as needed., Disp: , Rfl:  .  HYDROcodone-homatropine (HYCODAN) 5-1.5 MG/5ML syrup, Take 5 mLs by mouth every 6 (six) hours as needed., Disp: , Rfl:  .  lidocaine (LIDODERM) 5 %, Place onto the skin., Disp: , Rfl:  .  ondansetron (ZOFRAN ODT) 4 MG disintegrating tablet, Take 1 tablet (4 mg total) by mouth every 8 (eight) hours as needed for nausea or vomiting., Disp: 30 tablet, Rfl: 1 .  pantoprazole (PROTONIX) 40 MG tablet, Take 1 tablet (40 mg total) by mouth every other day., Disp: 30 tablet, Rfl: 2  ------------------------------------------------------------------------- Social History   Tobacco Use  . Smoking status: Never Smoker  . Smokeless tobacco: Never Used  Vaping Use  . Vaping Use: Never used  Substance Use Topics  . Alcohol use: No    Alcohol/week: 0.0 standard drinks  . Drug use: No    Review of Systems Per HPI unless specifically indicated above     Objective:    BP 130/64 (BP Location: Left Arm, Cuff Size: Normal)   Pulse 79   Ht  (1.651 m)   Wt 148 lb 9.6 oz (67.4 kg)  SpO2 100%   BMI 24.73 kg/m   Wt Readings from Last 3 Encounters:  09/04/20 148 lb 9.6 oz (67.4 kg)  01/07/20 157 lb 9.6 oz (71.5 kg)  08/18/18 160 lb 12.8 oz (72.9 kg)    Physical Exam Vitals and nursing note reviewed.  Constitutional:      General: She is not in acute distress.    Appearance: She is well-developed. She is not diaphoretic.     Comments: Currently mildly ill appearing in recovery from hospitalization, not at  baseline, age 70 female, in wheelchair, comfortable, cooperative  HENT:     Head: Normocephalic and atraumatic.  Eyes:     General:        Right eye: No discharge.        Left eye: No discharge.     Conjunctiva/sclera: Conjunctivae normal.  Neck:     Thyroid: No thyromegaly.  Cardiovascular:     Rate and Rhythm: Normal rate and regular rhythm.     Heart sounds: Normal heart sounds. No murmur heard.   Pulmonary:     Effort: Pulmonary effort is normal. No respiratory distress.     Breath sounds: No wheezing or rales.     Comments: Improved air movement, still not at baseline. Musculoskeletal:        General: Normal range of motion.     Cervical back: Normal range of motion and neck supple.     Right lower leg: No edema.     Left lower leg: No edema.  Lymphadenopathy:     Cervical: No cervical adenopathy.  Skin:    General: Skin is warm and dry.     Findings: No erythema or rash.  Neurological:     Mental Status: She is alert and oriented to person, place, and time.  Psychiatric:        Behavior: Behavior normal.     Comments: Well groomed, good eye contact, normal speech and thoughts      CT chest without contrast  Anatomical Region Laterality Modality  Chest - Computed Tomography    Imp[facility]  1. Relative to CT imaging of the chest from August 12, 2020, this study demonstrates persistent though significantly improved ground glass and consolidative opacities in the right lung. There may be a minor element of pre-existing or subsequent mild architectural distortion with scarring and bronchiectasis.  2. Relative to the same prior exam, there is been interval resolution of right pleural effusion.  3. There are a few persistent foci of centrilobular micronodularity within the left lung which likely relate to mucoid impaction versus infection.  Narrative Performed by [facility] EXAM: CT CHEST WO CONTRAST  DATE: 09/01/2020 11:12 AM  ACCESSION:  [id number]  DICTATED: 09/01/2020 1:47 PM  INTERPRETATION LOCATION: [facility]   CLINICAL INDICATION: 70 years old Female with Eval for resolution of pneumonia, look for underlying pathology ; Pneumonia, effusion or abscess suspected, xray done ; Abnormal xray - pleural effusion - J18.9 - Multifocal pneumonia - J96.01 - Acute hypoxemic respiratory failure (CMS - HCC) - J18.9 -    COMPARISON: CT chest dated 08/12/2020   TECHNIQUE: Ahelical CT scan was obtained without IV contrast from the thoracic inlet through the hemidiaphragms. Images were reconstructed in the axial plane. Coronal and sagittal reformatted images of the chest were also provided for further evaluation of the lung parenchyma.   FINDINGS:   AIRWAYS, LUNGS, PLEURA: Clear central tracheobronchial tree. Overall decreased diffuse ground glass opacities throughout the right lung.  Persistent multifocal confluent consolidative, groundglass and reticular opacities within the right upper, middle and lower lobes. There is associated mild bronchiectasis and bronchial wall thickening. Multifocal pulmonary micronodules within the left lower lobe (3:48, 3:59) these measure up to 0.3 cm left lower lobe nodule (3:65) and 0.5 cm within the left lingula (3:56), unchanged. Interval resolution of bilateral pleural effusions.   MEDIASTINUM: Normal heart size. No pericardial effusion. Normal caliber thoracic aorta. Stable prominent mediastinal lymph nodes measuring up to 1.0 cm along the right lower paratracheal region (2:20).   IMAGED ABDOMEN: Unremarkable.   SOFT TISSUES: Unremarkable.   BONES: Unchanged sclerotic focus left lateral seventh rib. Multilevel degenerative disc disease.  Procedure Note  Delsa Grana, MD - 09/01/2020  Formatting of this note might be different from the original.  EXAM: CT CHEST WO CONTRAST  DATE: 09/01/2020 11:12 AM  ACCESSION: [id number]  DICTATED: 09/01/2020 1:47 PM  INTERPRETATION LOCATION:  [facility]   CLINICAL INDICATION: 70 years old Female with Eval for resolution of pneumonia, look for underlying pathology ; Pneumonia, effusion or abscess suspected, xray done ; Abnormal xray - pleural effusion - J18.9 - Multifocal pneumonia - J96.01 - Acute hypoxemic respiratory failure (CMS - HCC) - J18.9 -    COMPARISON: CT chest dated 08/12/2020   TECHNIQUE: A helical CT scan was obtained without IV contrast from the thoracic inlet through the hemidiaphragms. Images were reconstructed in the axial plane. Coronal and sagittal reformatted images of the chest were also provided for further evaluation of the lung parenchyma.   FINDINGS:   AIRWAYS, LUNGS, PLEURA: Clear central tracheobronchial tree. Overall decreased diffuse ground glass opacities throughout the right lung. Persistent multifocal confluent consolidative, groundglass and reticular opacities within the right upper, middle and lower lobes. There is associated mild bronchiectasis and bronchial wall thickening. Multifocal pulmonary micronodules within the left lower lobe (3:48, 3:59) these measure up to 0.3 cm left lower lobe nodule (3:65) and 0.5 cm within the left lingula (3:56), unchanged. Interval resolution of bilateral pleural effusions.   MEDIASTINUM: Normal heart size. No pericardial effusion. Normal caliber thoracic aorta. Stable prominent mediastinal lymph nodes measuring up to 1.0 cm along the right lower paratracheal region (2:20).   IMAGED ABDOMEN: Unremarkable.   SOFT TISSUES: Unremarkable.   BONES: Unchanged sclerotic focus left lateral seventh rib. Multilevel degenerative disc disease.    IMPRESSION:   1. Relative to CT imaging of the chest from August 12, 2020, this study demonstrates persistent though significantly improved ground glass and consolidative opacities inthe right lung. There may be a minor element of pre-existing or subsequent mild architectural distortion with scarring and bronchiectasis.   2. Relative to the same prior exam, there is been interval resolution of right pleural effusion.  3. There are afew persistent foci of centrilobular micronodularity within the left lung which likely relate to mucoid impaction versus infection. Exam End: 09/01/20 11:12 AM   Specimen Collected: 09/01/20 1:47 PM Last Resulted: 09/01/20 8:11 PM  Received From: [facility]  Result Received: 09/04/20 9:01 AM      Results for orders placed or performed in visit on 01/03/20  T4, free  Result Value Ref Range   Free T4 1.0 0.8 - 1.8 ng/dL  TSH  Result Value Ref Range   TSH 3.86 0.40 - 4.50 mIU/L  VITAMIN D 25 Hydroxy (Vit-D Deficiency, Fractures)  Result Value Ref Range   Vit D, 25-Hydroxy 11 (L) 30 - 100 ng/mL  CBC with Differential/Platelet  Result Value Ref Range  WBC 5.0 3.8 - 10.8 Thousand/uL   RBC 4.31 3.80 - 5.10 Million/uL   Hemoglobin 11.7 11.7 - 15.5 g/dL   HCT 93.5 70.1 - 77.9 %   MCV 83.3 80.0 - 100.0 fL   MCH 27.1 27.0 - 33.0 pg   MCHC 32.6 32.0 - 36.0 g/dL   RDW 39.0 30.0 - 92.3 %   Platelets 258 140 - 400 Thousand/uL   MPV 10.6 7.5 - 12.5 fL   Neutro Abs 2,755 1,500 - 7,800 cells/uL   Lymphs Abs 1,835 850 - 3,900 cells/uL   Absolute Monocytes 240 200 - 950 cells/uL   Eosinophils Absolute 130 15 - 500 cells/uL   Basophils Absolute 40 0 - 200 cells/uL   Neutrophils Relative % 55.1 %   Total Lymphocyte 36.7 %   Monocytes Relative 4.8 %   Eosinophils Relative 2.6 %   Basophils Relative 0.8 %  Lipid panel  Result Value Ref Range   Cholesterol 200 (H) <200 mg/dL   HDL 56 > OR = 50 mg/dL   Triglycerides 96 <300 mg/dL   LDL Cholesterol (Calc) 124 (H) mg/dL (calc)   Total CHOL/HDL Ratio 3.6 <5.0 (calc)   Non-HDL Cholesterol (Calc) 144 (H) <130 mg/dL (calc)  Hemoglobin T6A  Result Value Ref Range   Hgb A1c MFr Bld 5.4 <5.7 % of total Hgb   Mean Plasma Glucose 108 (calc)   eAG (mmol/L) 6.0 (calc)  COMPLETE METABOLIC PANEL WITH GFR  Result Value Ref Range    Glucose, Bld 86 65 - 99 mg/dL   BUN 7 7 - 25 mg/dL   Creat 2.63 3.35 - 4.56 mg/dL   GFR, Est Non African American 99 > OR = 60 mL/min/1.28m2   GFR, Est African American 115 > OR = 60 mL/min/1.15m2   BUN/Creatinine Ratio NOT APPLICABLE 6 - 22 (calc)   Sodium 144 135 - 146 mmol/L   Potassium 4.2 3.5 - 5.3 mmol/L   Chloride 110 98 - 110 mmol/L   CO2 29 20 - 32 mmol/L   Calcium 10.6 (H) 8.6 - 10.4 mg/dL   Total Protein 6.4 6.1 - 8.1 g/dL   Albumin 4.2 3.6 - 5.1 g/dL   Globulin 2.2 1.9 - 3.7 g/dL (calc)   AG Ratio 1.9 1.0 - 2.5 (calc)   Total Bilirubin 0.4 0.2 - 1.2 mg/dL   Alkaline phosphatase (APISO) 90 37 - 153 U/L   AST 11 10 - 35 U/L   ALT 12 6 - 29 U/L      Assessment & Plan:   Problem List Items Addressed This Visit   None   Visit Diagnoses    Multifocal pneumonia    -  Primary   Relevant Medications   guaifenesin (ROBITUSSIN) 100 MG/5ML syrup   HYDROcodone-homatropine (HYCODAN) 5-1.5 MG/5ML syrup   Other Relevant Orders   Ambulatory referral to Pulmonology   Bronchiectasis without complication (HCC)       Relevant Orders   Ambulatory referral to Pulmonology   Nausea       Relevant Medications   ondansetron (ZOFRAN ODT) 4 MG disintegrating tablet   Generalized weakness          Clinically with complicated hospitalization at [facility] with multifocal pneumonia  - Improving overall Not back to baseline yet No prior chronic lung disease, uncertain reason for difficulty improving from pneumonia Completed antibiotics and additional therapy  Recent CT reviewed 08/22/20, see above, improved but unresolved. Underlying brochiectasis.  Will refer to [facility] for follow-up from  this issue, will need another repeat CT 6-8 weeks approx from now.  Counseling on improving nutrition with Ensure/Boost, may HOLD iron supplement temporarily can upset stomach, goal to improve nutrition, will re order Zofran ODT nausea PRN to help her take in nutrition  Keep up with  Home Health  Will complete Short Term Disability paperwork for her - she is unable to work up to 3 months from now due to this hospitalization, pneumonia, lung complications.  08/12/20 initial start - through 3 month  Her daughter Archie Patten will need FMLA paperwork for intermittent care to serve as primary caregiver for her mother who is ill, and Archie Patten is needed for care of her mother with assistance with transportation, general hygiene, meal prep, medication admin. Her daughter may need time up to 3 times per week for flare ups for 8 hours per flare up also she will need transportation to doctors appointments up to 3 times per month for 1 day per appointment.   Meds ordered this encounter  Medications  . ondansetron (ZOFRAN ODT) 4 MG disintegrating tablet    Sig: Take 1 tablet (4 mg total) by mouth every 8 (eight) hours as needed for nausea or vomiting.    Dispense:  30 tablet    Refill:  1    Follow up plan: Return in about 6 weeks (around 10/16/2020) for 6 week follow-up Short Term Disability, Pulm updates.    Saralyn Pilar, DO [facility] 09/04/2020, 9:41 AM

## 2020-09-04 NOTE — Patient Instructions (Addendum)
Thank you for coming to the office today.   CT Scan from 09/01/20 has shown significant improvement but still not back to normal. There are some areas of underlying lung issue that may have made this problem worse.  Referral in to Pulmonologist at [facility] - they will contact you to schedule an apt and repeat CT 6-8 weeks again.  Consider future issue with Gallbladder, if resolved, no further action, if recurrent Right upper abdominal pain nausea vomiting and other concerns may warrant further evaluation by General Surgeon.  Please schedule a Follow-up Appointment to: Return in about 6 weeks (around 10/16/2020) for 6 week follow-up Short Term Disability, Pulm updates.  If you have any other questions or concerns, please feel free to call the office or send a message through MyChart. You may also schedule an earlier appointment if necessary.  Additionally, you may be receiving a survey about your experience at our office within a few days to 1 week by e-mail or mail. We value your feedback.  Saralyn Pilar, DO [facility] [place]

## 2020-09-04 NOTE — Telephone Encounter (Signed)
This is a good question, ultimately it is up to the patient if she feels like it is beneficial or not.  I generally recommend to continue PT given she is still not back to her baseline.  If it is covered, and they have the service available, I would recommend continue.  If they are closing her case and she feels like may no longer need it as much, that is okay to stop.  Saralyn Pilar, DO [facility] 09/04/2020, 4:19 PM

## 2020-09-04 NOTE — Telephone Encounter (Signed)
Copied from CRM [id number]. Topic: General - Other >> Sep 04, 2020 12:28 PM Gwenlyn Fudge wrote: Reason for CRM: Pt called stating that she forgot to mention this at her appt, but wonders if she can have advice regarding whether  she should continue with PT or if she should sign off on it. Please advise.

## 2020-09-05 NOTE — Telephone Encounter (Signed)
Pt is calling back and would like dr Kirtland Bouchard recommendation on stopping PT

## 2020-09-05 NOTE — Telephone Encounter (Signed)
Patient has been notified of recommendations and she would like to continue with PT at this time.. I asked her to call us back if she has any issues with that.

## 2020-09-19 ENCOUNTER — Telehealth: Payer: Self-pay | Admitting: Family Medicine

## 2020-09-19 NOTE — Telephone Encounter (Signed)
Patient states that they have not received the health care provider cetification form for disability to be approved. Please send asap  Fax# [employer] [phone].

## 2020-09-22 ENCOUNTER — Telehealth: Payer: Self-pay

## 2020-09-22 NOTE — Telephone Encounter (Signed)
I called the patient and informed her that the referral coordinator called and checked on the status of her referral and that someone should be calling her in a few days to schedule an appt.  She complaining of coughing, hoarseness and SOB that seems to worsen late in the afternoon. Appt scheduled with Dr. Kirtland Bouchard on Tuesday, April 12th. I also informed the patient if her symptoms worsen she need to seek emergent care. She verbalize understanding, no questions or concerns.

## 2020-09-22 NOTE — Telephone Encounter (Signed)
Patient states that she has not be contacted for her appointment with the pulmonologist and would like a callback.

## 2020-09-26 ENCOUNTER — Other Ambulatory Visit: Payer: Self-pay

## 2020-09-26 ENCOUNTER — Encounter: Payer: Self-pay | Admitting: Family Medicine

## 2020-09-26 ENCOUNTER — Ambulatory Visit: Payer: 59 | Admitting: Family Medicine

## 2020-09-26 VITALS — BP 136/88 | HR 88 | Temp 97.5°F | Ht 65.0 in | Wt 151.2 lb

## 2020-09-26 DIAGNOSIS — J479 Bronchiectasis, uncomplicated: Secondary | ICD-10-CM | POA: Diagnosis not present

## 2020-09-26 DIAGNOSIS — J189 Pneumonia, unspecified organism: Secondary | ICD-10-CM

## 2020-09-26 MED ORDER — ALBUTEROL SULFATE HFA 108 (90 BASE) MCG/ACT IN AERS: 2.0000 | INHALATION_SPRAY | RESPIRATORY_TRACT | 2 refills | Status: DC | PRN

## 2020-09-26 MED ORDER — PREDNISONE 20 MG PO TABS: ORAL_TABLET | ORAL | 0 refills | Status: DC

## 2020-09-26 MED ORDER — BENZONATATE 100 MG PO CAPS: 100.0000 mg | ORAL_CAPSULE | Freq: Three times a day (TID) | ORAL | 0 refills | Status: DC | PRN

## 2020-09-26 NOTE — Progress Notes (Signed)
Subjective:    Patient ID: Sophia Schmidt, female    DOB: Dec 13, 1950, 70 y.o.   MRN: [id number]  Sophia Schmidt is a 70 y.o. female presenting on 09/26/2020 for Cough (Pt has taken Robitussin it has not helped. Pt feels chest sore of so much coughing.) and Shortness of Breath (Pt states while being active and even talking on the phone she runs out of breath. Pt was diagnosed of pneumonia on 2/26.)  Accompanied by husband, Clark today.  HPI  Cough Dyspnea History Multifocal Pneumonia / Bronchiectasis Recent HFU on 09/04/20 with me after prior Multifocal pneumonia complex hospitalization in 07/2020. She had been referred to [facility] on 3/21, and now still awaiting appointment. She completed antibiotics and treatments in hospital at that time.  Today now has worsening reported shortness of breath and coughing, productive phlegm with cough at times. She has been more short of breath with talking and activities.  Not on any steroid course or rescue inhaler  Denies any fever or chills sweats, wheezing, swelling, chest pain - admits chest soreness only from coughing.   Depression screen [facility] 2/9 01/07/2020 12/02/2018 08/18/2018  Decreased Interest 0 0 0  Down, Depressed, Hopeless 0 0 0  PHQ - 2 Score 0 0 0    Social History   Tobacco Use  . Smoking status: Never Smoker  . Smokeless tobacco: Never Used  Vaping Use  . Vaping Use: Never used  Substance Use Topics  . Alcohol use: No    Alcohol/week: 0.0 standard drinks  . Drug use: No    Review of Systems Per HPI unless specifically indicated above     Objective:    BP 136/88 (BP Location: Left Arm, Patient Position: Sitting, Cuff Size: Normal)   Pulse 88   Temp (!) 97.5 F (36.4 C) (Temporal)   Ht 5\' 5"  (1.651 m)   Wt 151 lb 3.2 oz (68.6 kg)   SpO2 100%   BMI 25.16 kg/m   Wt Readings from Last 3 Encounters:  09/26/20 151 lb 3.2 oz (68.6 kg)  09/04/20 148 lb 9.6 oz (67.4 kg)  01/07/20 157 lb 9.6 oz (71.5 kg)     Physical Exam Vitals and nursing note reviewed.  Constitutional:      General: She is not in acute distress.    Appearance: She is well-developed. She is not diaphoretic.     Comments: Well-appearing, comfortable, cooperative  HENT:     Head: Normocephalic and atraumatic.  Eyes:     General:        Right eye: No discharge.        Left eye: No discharge.     Conjunctiva/sclera: Conjunctivae normal.  Neck:     Thyroid: No thyromegaly.  Cardiovascular:     Rate and Rhythm: Normal rate and regular rhythm.     Heart sounds: Normal heart sounds. No murmur heard.   Pulmonary:     Effort: No respiratory distress.     Breath sounds: Normal breath sounds. No decreased breath sounds, wheezing or rales.     Comments: Occasional coughing spell during exam  Mild increased respiratory effort. Musculoskeletal:        General: Normal range of motion.     Cervical back: Normal range of motion and neck supple.  Lymphadenopathy:     Cervical: No cervical adenopathy.  Skin:    General: Skin is warm and dry.     Findings: No erythema or rash.  Neurological:  Mental Status: She is alert and oriented to person, place, and time.  Psychiatric:        Behavior: Behavior normal.     Comments: Well groomed, good eye contact, normal speech and thoughts        Results for orders placed or performed in visit on 01/03/20  T4, free  Result Value Ref Range   Free T4 1.0 0.8 - 1.8 ng/dL  TSH  Result Value Ref Range   TSH 3.86 0.40 - 4.50 mIU/L  VITAMIN D 25 Hydroxy (Vit-D Deficiency, Fractures)  Result Value Ref Range   Vit D, 25-Hydroxy 11 (L) 30 - 100 ng/mL  CBC with Differential/Platelet  Result Value Ref Range   WBC 5.0 3.8 - 10.8 Thousand/uL   RBC 4.31 3.80 - 5.10 Million/uL   Hemoglobin 11.7 11.7 - 15.5 g/dL   HCT 31.5 17.6 - 16.0 %   MCV 83.3 80.0 - 100.0 fL   MCH 27.1 27.0 - 33.0 pg   MCHC 32.6 32.0 - 36.0 g/dL   RDW 73.7 10.6 - 26.9 %   Platelets 258 140 - 400 Thousand/uL    MPV 10.6 7.5 - 12.5 fL   Neutro Abs 2,755 1,500 - 7,800 cells/uL   Lymphs Abs 1,835 850 - 3,900 cells/uL   Absolute Monocytes 240 200 - 950 cells/uL   Eosinophils Absolute 130 15 - 500 cells/uL   Basophils Absolute 40 0 - 200 cells/uL   Neutrophils Relative % 55.1 %   Total Lymphocyte 36.7 %   Monocytes Relative 4.8 %   Eosinophils Relative 2.6 %   Basophils Relative 0.8 %  Lipid panel  Result Value Ref Range   Cholesterol 200 (H) <200 mg/dL   HDL 56 > OR = 50 mg/dL   Triglycerides 96 <485 mg/dL   LDL Cholesterol (Calc) 124 (H) mg/dL (calc)   Total CHOL/HDL Ratio 3.6 <5.0 (calc)   Non-HDL Cholesterol (Calc) 144 (H) <130 mg/dL (calc)  Hemoglobin I6E  Result Value Ref Range   Hgb A1c MFr Bld 5.4 <5.7 % of total Hgb   Mean Plasma Glucose 108 (calc)   eAG (mmol/L) 6.0 (calc)  COMPLETE METABOLIC PANEL WITH GFR  Result Value Ref Range   Glucose, Bld 86 65 - 99 mg/dL   BUN 7 7 - 25 mg/dL   Creat 7.03 5.00 - 9.38 mg/dL   GFR, Est Non African American 99 > OR = 60 mL/min/1.35m2   GFR, Est African American 115 > OR = 60 mL/min/1.106m2   BUN/Creatinine Ratio NOT APPLICABLE 6 - 22 (calc)   Sodium 144 135 - 146 mmol/L   Potassium 4.2 3.5 - 5.3 mmol/L   Chloride 110 98 - 110 mmol/L   CO2 29 20 - 32 mmol/L   Calcium 10.6 (H) 8.6 - 10.4 mg/dL   Total Protein 6.4 6.1 - 8.1 g/dL   Albumin 4.2 3.6 - 5.1 g/dL   Globulin 2.2 1.9 - 3.7 g/dL (calc)   AG Ratio 1.9 1.0 - 2.5 (calc)   Total Bilirubin 0.4 0.2 - 1.2 mg/dL   Alkaline phosphatase (APISO) 90 37 - 153 U/L   AST 11 10 - 35 U/L   ALT 12 6 - 29 U/L      Assessment & Plan:   Problem List Items Addressed This Visit   None   Visit Diagnoses    Multifocal pneumonia    -  Primary   Relevant Medications   albuterol (VENTOLIN HFA) 108 (90 Base) MCG/ACT inhaler   benzonatate (  TESSALON) 100 MG capsule   predniSONE (DELTASONE) 20 MG tablet   Bronchiectasis without complication (HCC)       Relevant Medications   albuterol (VENTOLIN HFA)  108 (90 Base) MCG/ACT inhaler   benzonatate (TESSALON) 100 MG capsule   predniSONE (DELTASONE) 20 MG tablet       Clinically with complicated hospitalization at [facility] with multifocal pneumonia  Had been improving Now some recent worsening / setback No sign of acute infection based on exam and history Increased dyspnea Concern with history of identified bronchiectasis on CT imaging Has not been scheduled yet by [facility] - looks like our referral team contacted them today and spoke to [facility] who has tried to schedule patient but patient/husband today say that they have not heard from [facility]  Recent CT reviewed 08/22/20, see above, improved but unresolved. Underlying brochiectasis.  She will need likely repeat CT within next several weeks or at upcoming Pulm apt.  Pulse ox 100%, hemodynamically stable.  Ordered Albuterol PRN Prednisone taper 7 day Start Tessalon Perls take 1 capsule up to 3 times a day as needed for cough  She will call [facility]ology today to schedule ASAP  Return criteria given if worsening    Meds ordered this encounter  Medications  . albuterol (VENTOLIN HFA) 108 (90 Base) MCG/ACT inhaler    Sig: Inhale 2 puffs into the lungs every 4 (four) hours as needed for wheezing or shortness of breath (cough).    Dispense:  1 each    Refill:  2  . benzonatate (TESSALON) 100 MG capsule    Sig: Take 1 capsule (100 mg total) by mouth 3 (three) times daily as needed for cough.    Dispense:  30 capsule    Refill:  0  . predniSONE (DELTASONE) 20 MG tablet    Sig: Take daily with food. Start with 60mg  (3 pills) x 2 days, then reduce to 40mg  (2 pills) x 2 days, then 20mg  (1 pill) x 3 days    Dispense:  13 tablet    Refill:  0      Follow up plan: Return if symptoms worsen or fail to improve.   , DO [facility] 09/26/2020, 2:45 PM

## 2020-09-26 NOTE — Patient Instructions (Addendum)
Thank you for coming to the office today.  Referral was placed on 09/06/20, our team contacted them again today 4/12 - they spoke with someone named Lonni Fix at the [facility] and it sounds like you would need to call them to schedule the new patient appointment.  [facility] (Pulmonology) Address: [address], [place], [place] [address] Phone: [phone]  Start Prednisone taper  Start Sondra Come take 1 capsule up to 3 times a day as needed for cough  Albuterol rescue inhaler only as needed 2 puffs every 4-6 hours for cough shortness of breath or wheezing.   Please schedule a Follow-up Appointment to: Return if symptoms worsen or fail to improve.  If you have any other questions or concerns, please feel free to call the office or send a message through MyChart. You may also schedule an earlier appointment if necessary.  Additionally, you may be receiving a survey about your experience at our office within a few days to 1 week by e-mail or mail. We value your feedback.  Saralyn Pilar, DO [facility], [place]

## 2020-10-20 ENCOUNTER — Ambulatory Visit: Payer: 59 | Admitting: Family Medicine

## 2020-10-23 ENCOUNTER — Ambulatory Visit: Payer: 59 | Admitting: Family Medicine

## 2020-10-23 ENCOUNTER — Encounter: Payer: Self-pay | Admitting: Family Medicine

## 2020-10-23 ENCOUNTER — Other Ambulatory Visit: Payer: Self-pay

## 2020-10-23 VITALS — BP 146/82 | HR 87 | Ht 65.0 in | Wt 153.0 lb

## 2020-10-23 DIAGNOSIS — J189 Pneumonia, unspecified organism: Secondary | ICD-10-CM | POA: Diagnosis not present

## 2020-10-23 DIAGNOSIS — J479 Bronchiectasis, uncomplicated: Secondary | ICD-10-CM | POA: Diagnosis not present

## 2020-10-23 MED ORDER — BENZONATATE 100 MG PO CAPS
100.0000 mg | ORAL_CAPSULE | Freq: Three times a day (TID) | ORAL | 2 refills | Status: DC | PRN
Start: 2020-10-23 — End: 2021-03-14

## 2020-10-23 NOTE — Patient Instructions (Addendum)
Thank you for coming to the office today.  We will complete the STD / FMLA [employer] extension, our request is 3 months, return to full time work approximately on 01/22/21.  Refilled Benzonatate perls for cough 90 pills with refills, [facility] shoulder have additional inhaler refills for Albuterol.  Stay tuned for CT scan and Pulm follow-up, let us know if needed.  Contact if need to return sooner, otherwise 6 weeks.  Please schedule a Follow-up Appointment to: Return in about 6 weeks (around 12/04/2020) for 6 week follow-up Short Term / Pulm updates / return to work 01/22/21?Marland Kitchen  If you have any other questions or concerns, please feel free to call the office or send a message through MyChart. You may also schedule an earlier appointment if necessary.  Additionally, you may be receiving a survey about your experience at our office within a few days to 1 week by e-mail or mail. We value your feedback.  Saralyn Pilar, DO [facility], [place]

## 2020-10-23 NOTE — Progress Notes (Addendum)
Subjective:    Patient ID: Sophia Schmidt, female    DOB: July 13, 1950, 70 y.o.   MRN: [id number]  Sophia Schmidt is a 70 y.o. female presenting on 10/23/2020 for Pneumonia and Shortness of Breath   HPI   Accompanied by daughter Virgina Norfolk   History Multifocal Pneumonia / Bronchiectasis Chronic Dyspnea - Last visit with me 09/26/20, for follow-up for same problem dyspnea / HFU pneumonia, treated with continued tessalon cough medicine and referral to [facility], see prior notes for background information.  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 They are going to order / approve a CT Lung scan repeat in the near future, and may follow up within 3 months or sooner.  Currently she feels gradually improved but not able to return to work. Still has shortness of breath with talking, and activity.  FMLA Update Paperwork reviewed today from [employer] They are extending her Short Term Disability (STD) Claim and these payments were extended and approved through 11/06/20. All absences from work from 08/10/20 to 11/06/20 were covered under FMLA.  She is unable to return to work on 11/06/20. She is requesting further extension on STD/FMLA.  She is working in Therapist, nutritional area. She cannot lift >10 lbs due to dyspnea She cannot walk or bend frequently for prolonged time due to dyspnea She cannot return to work environment due to dust and environmental triggers.   Depression screen [facility] 2/9 10/23/2020 01/07/2020 12/02/2018  Decreased Interest 3 0 0  Down, Depressed, Hopeless 3 0 0  PHQ - 2 Score 6 0 0  Altered sleeping 0 - -  Tired, decreased energy 3 - -  Feeling bad or failure about yourself  0 - -  Trouble concentrating 0 - -  Moving slowly or fidgety/restless 0 - -  Suicidal  thoughts 0 - -  PHQ-9 Score 9 - -  Difficult doing work/chores Not difficult at all - -    Social History   Tobacco Use  . Smoking status: Never Smoker  . Smokeless tobacco: Never Used  Vaping Use  . Vaping Use: Never used  Substance Use Topics  . Alcohol use: No    Alcohol/week: 0.0 standard drinks  . Drug use: No    Review of Systems Per HPI unless specifically indicated above     Objective:    BP (!) 146/82   Pulse 87   Ht 5\' 5"  (1.651 m)   Wt 153 lb (69.4 kg)   SpO2 96%   BMI 25.46 kg/m   Wt Readings from Last 3 Encounters:  10/23/20 153 lb (69.4 kg)  09/26/20 151 lb 3.2 oz (68.6 kg)  09/04/20 148 lb 9.6 oz (67.4 kg)    Physical Exam Vitals and nursing note reviewed.  Constitutional:      General: She is not in acute distress.    Appearance: She is well-developed. She is not diaphoretic.     Comments: Well-appearing, comfortable, cooperative  HENT:     Head: Normocephalic and atraumatic.  Eyes:     General:        Right eye: No discharge.        Left eye: No discharge.     Conjunctiva/sclera: Conjunctivae normal.  Neck:     Thyroid: No thyromegaly.  Cardiovascular:  Rate and Rhythm: Normal rate and regular rhythm.     Heart sounds: Normal heart sounds. No murmur heard.   Pulmonary:     Effort: Pulmonary effort is normal. No respiratory distress.     Breath sounds: Normal breath sounds. No wheezing or rales.     Comments: Improved work of breathing. Still very mild reduced air movement that has improved. Musculoskeletal:        General: Normal range of motion.     Cervical back: Normal range of motion and neck supple.  Lymphadenopathy:     Cervical: No cervical adenopathy.  Skin:    General: Skin is warm and dry.     Findings: No erythema or rash.  Neurological:     Mental Status: She is alert and oriented to person, place, and time.  Psychiatric:        Behavior: Behavior normal.     Comments: Well groomed, good eye contact, normal speech  and thoughts    Results for orders placed or performed in visit on 01/03/20  T4, free  Result Value Ref Range   Free T4 1.0 0.8 - 1.8 ng/dL  TSH  Result Value Ref Range   TSH 3.86 0.40 - 4.50 mIU/L  VITAMIN D 25 Hydroxy (Vit-D Deficiency, Fractures)  Result Value Ref Range   Vit D, 25-Hydroxy 11 (L) 30 - 100 ng/mL  CBC with Differential/Platelet  Result Value Ref Range   WBC 5.0 3.8 - 10.8 Thousand/uL   RBC 4.31 3.80 - 5.10 Million/uL   Hemoglobin 11.7 11.7 - 15.5 g/dL   HCT 69.4 85.4 - 62.7 %   MCV 83.3 80.0 - 100.0 fL   MCH 27.1 27.0 - 33.0 pg   MCHC 32.6 32.0 - 36.0 g/dL   RDW 03.5 00.9 - 38.1 %   Platelets 258 140 - 400 Thousand/uL   MPV 10.6 7.5 - 12.5 fL   Neutro Abs 2,755 1,500 - 7,800 cells/uL   Lymphs Abs 1,835 850 - 3,900 cells/uL   Absolute Monocytes 240 200 - 950 cells/uL   Eosinophils Absolute 130 15 - 500 cells/uL   Basophils Absolute 40 0 - 200 cells/uL   Neutrophils Relative % 55.1 %   Total Lymphocyte 36.7 %   Monocytes Relative 4.8 %   Eosinophils Relative 2.6 %   Basophils Relative 0.8 %  Lipid panel  Result Value Ref Range   Cholesterol 200 (H) <200 mg/dL   HDL 56 > OR = 50 mg/dL   Triglycerides 96 <829 mg/dL   LDL Cholesterol (Calc) 124 (H) mg/dL (calc)   Total CHOL/HDL Ratio 3.6 <5.0 (calc)   Non-HDL Cholesterol (Calc) 144 (H) <130 mg/dL (calc)  Hemoglobin H3Z  Result Value Ref Range   Hgb A1c MFr Bld 5.4 <5.7 % of total Hgb   Mean Plasma Glucose 108 (calc)   eAG (mmol/L) 6.0 (calc)  COMPLETE METABOLIC PANEL WITH GFR  Result Value Ref Range   Glucose, Bld 86 65 - 99 mg/dL   BUN 7 7 - 25 mg/dL   Creat 1.69 6.78 - 9.38 mg/dL   GFR, Est Non African American 99 > OR = 60 mL/min/1.1m2   GFR, Est African American 115 > OR = 60 mL/min/1.9m2   BUN/Creatinine Ratio NOT APPLICABLE 6 - 22 (calc)   Sodium 144 135 - 146 mmol/L   Potassium 4.2 3.5 - 5.3 mmol/L   Chloride 110 98 - 110 mmol/L   CO2 29 20 - 32 mmol/L   Calcium 10.6 (H) 8.6 -  10.4  mg/dL   Total Protein 6.4 6.1 - 8.1 g/dL   Albumin 4.2 3.6 - 5.1 g/dL   Globulin 2.2 1.9 - 3.7 g/dL (calc)   AG Ratio 1.9 1.0 - 2.5 (calc)   Total Bilirubin 0.4 0.2 - 1.2 mg/dL   Alkaline phosphatase (APISO) 90 37 - 153 U/L   AST 11 10 - 35 U/L   ALT 12 6 - 29 U/L      Assessment & Plan:   Problem List Items Addressed This Visit   None   Visit Diagnoses    Bronchiectasis without complication (HCC)    -  Primary   Relevant Medications   benzonatate (TESSALON) 100 MG capsule   Multifocal pneumonia       Relevant Medications   benzonatate (TESSALON) 100 MG capsule     Bronchiectasis Hx Multifocal pneumonia - resolved, not active.  Followed by [facility] Dr Eliberto Ivory Upcoming repeat CT and follow-up as planned Gradual improvement Continues on cough suppression PRN. Awaiting further Pulm eval and time to continue to heal No further changes See below request for extension of return to work date.  STD / FMLA [employer] Extension Questionnaire.  1. Last office visit date? - 10/23/20 2. Next office visit? - 6 weeks approx 12/04/20 3. Current exam? Mild improved air movement overall. 4. Current treatment plan? Refill Benzonatate, Albuterol PRN cough. Already referred to [facility], has upcoming repeat CT Scan and follow-up 5. Is patient attending Physical Therapy? No 6. Start date? N/A 7. For how many weeks? N/a 8. Current restrictions and limitations? Yes - Cannot work due to shortness of breath impacting walking, standing, bending, lifting >10 lbs. 9. Current med list - see attached 10. Part time estimated return to work date? N/A cannot return part time. 11. How many hours per day? N/A 12. Estimated part time return to work date? N/A cannot return to part time currently. 13. How many hours can the patient work per day? N/A 14. Full time estimated return to work date? Estimated return date 01/22/21 15. Fax copy of last 2 office notes.  Caseworker Cala Bradford Dipippo - Phone  [phone] email [email or web address]  ------------------------------------------------------   Meds ordered this encounter  Medications  . benzonatate (TESSALON) 100 MG capsule    Sig: Take 1 capsule (100 mg total) by mouth 3 (three) times daily as needed for cough.    Dispense:  90 capsule    Refill:  2    Follow up plan: Return in about 6 weeks (around 12/04/2020) for 6 week follow-up Short Term / Pulm updates / return to work 01/22/21?Marland Kitchen   Saralyn Pilar, DO [facility] 10/23/2020, 2:24 PM

## 2020-12-04 ENCOUNTER — Encounter: Payer: Self-pay | Admitting: Family Medicine

## 2020-12-04 ENCOUNTER — Other Ambulatory Visit: Payer: Self-pay

## 2020-12-04 ENCOUNTER — Ambulatory Visit (INDEPENDENT_AMBULATORY_CARE_PROVIDER_SITE_OTHER): Payer: 59 | Admitting: Family Medicine

## 2020-12-04 VITALS — BP 130/68 | HR 71 | Ht 65.0 in | Wt 161.2 lb

## 2020-12-04 DIAGNOSIS — J479 Bronchiectasis, uncomplicated: Secondary | ICD-10-CM

## 2020-12-04 NOTE — Patient Instructions (Addendum)
Thank you for coming to the office today.  CT lungs shows nearly resolved pneumonia Still have scar tissue and abnormal lung structure which is causing you to react with cough and also have shortness of breath.  Please discuss with lung specialist about prescription medication for inhalers or other treatment options to improve your baseline lung function right now.  BP is improved on re-check  You should have enough Benzontate for cough through August, call if need more  LIkely cough is just a reaction to the lung problems we are describing, not a sign of illness.  Return to work fully duty still planned 01/22/21  DUE for FASTING BLOOD WORK (no food or drink after midnight before the lab appointment, only water or coffee without cream/sugar on the morning of)  SCHEDULE "Lab Only" visit in the morning at the clinic for lab draw in 3 MONTHS   - Make sure Lab Only appointment is at about 1 week before your next appointment, so that results will be available  For Lab Results, once available within 2-3 days of blood draw, you can can log in to MyChart online to view your results and a brief explanation. Also, we can discuss results at next follow-up visit.    Please schedule a Follow-up Appointment to: Return in about 3 months (around 03/06/2021) for 3 month fasting lab only then 1 week later Annual Physical.  If you have any other questions or concerns, please feel free to call the office or send a message through MyChart. You may also schedule an earlier appointment if necessary.  Additionally, you may be receiving a survey about your experience at our office within a few days to 1 week by e-mail or mail. We value your feedback.  Saralyn Pilar, DO [facility], [place]

## 2020-12-04 NOTE — Progress Notes (Signed)
Subjective:    Patient ID: Sophia Schmidt, female    DOB: June 16, 1951, 70 y.o.   MRN: [id number]  Sophia Schmidt is a 70 y.o. female presenting on 12/04/2020 for Shortness of Breath   HPI  History Multifocal Pneumonia / Bronchiectasis Chronic Dyspnea - Last visit with me 09/26/20 and 10/23/20 for follow-up for same problem dyspnea / HFU pneumonia, treated with continued tessalon cough medicine and referral to [facility], see prior notes for background information.  updates continues to take Tessalon Perls BID, also she is scheduled to return to [facility] sooner on 12/15/20 instead of end of August. She is still on schedule to return to work Full Time on 01/22/21, based on her previous FMLA claim that we have extended at last visit.   Interval update, she has established with [facility] Pulmonology she saw Dr Otis Dials on 10/20/20, and they were discussing that she may have had underlying scar tissue on the Left lung and that may be why she was more susceptible to Pneumonia infection.   Reviewed imaging below. She will pursue follow up with Pulm to review    Currently she feels gradually improved but not able to return to work. Still has shortness of breath with talking, and activity but it is improved overall  Her FMLA was extended last visit to return 01/22/21  Currently She is working in receiving/shipping area. She cannot lift >10 lbs due to dyspnea She cannot walk or bend frequently for prolonged time due to dyspnea She cannot return to work environment due to dust and environmental triggers.   Depression screen [facility] 2/9 12/04/2020 10/23/2020 01/07/2020  Decreased Interest 0 3 0  Down, Depressed, Hopeless 2 3 0  PHQ - 2 Score 2 6 0  Altered sleeping 2 0 -  Tired, decreased energy 2 3 -  Change in appetite 3 - -  Feeling bad or failure about yourself  3 0 -  Trouble concentrating 0 0 -  Moving slowly or fidgety/restless 3 0 -  Suicidal thoughts 1 0 -  PHQ-9 Score 16 9 -  Difficult doing  work/chores Extremely dIfficult Not difficult at all -    Social History   Tobacco Use   Smoking status: Never   Smokeless tobacco: Never  Vaping Use   Vaping Use: Never used  Substance Use Topics   Alcohol use: No    Alcohol/week: 0.0 standard drinks   Drug use: No    Review of Systems Per HPI unless specifically indicated above     Objective:    BP 130/68 (BP Location: Left Arm, Cuff Size: Normal)   Pulse 71   Ht 5\' 5"  (1.651 m)   Wt 161 lb 3.2 oz (73.1 kg)   SpO2 100%   BMI 26.83 kg/m   Wt Readings from Last 3 Encounters:  12/04/20 161 lb 3.2 oz (73.1 kg)  10/23/20 153 lb (69.4 kg)  09/26/20 151 lb 3.2 oz (68.6 kg)    Physical Exam Results for orders placed or performed in visit on 01/03/20  T4, free  Result Value Ref Range   Free T4 1.0 0.8 - 1.8 ng/dL  TSH  Result Value Ref Range   TSH 3.86 0.40 - 4.50 mIU/L  VITAMIN D 25 Hydroxy (Vit-D Deficiency, Fractures)  Result Value Ref Range   Vit D, 25-Hydroxy 11 (L) 30 - 100 ng/mL  CBC with Differential/Platelet  Result Value Ref Range   WBC 5.0 3.8 - 10.8 Thousand/uL   RBC 4.31  3.80 - 5.10 Million/uL   Hemoglobin 11.7 11.7 - 15.5 g/dL   HCT 32.2 02.5 - 42.7 %   MCV 83.3 80.0 - 100.0 fL   MCH 27.1 27.0 - 33.0 pg   MCHC 32.6 32.0 - 36.0 g/dL   RDW 06.2 37.6 - 28.3 %   Platelets 258 140 - 400 Thousand/uL   MPV 10.6 7.5 - 12.5 fL   Neutro Abs 2,755 1,500 - 7,800 cells/uL   Lymphs Abs 1,835 850 - 3,900 cells/uL   Absolute Monocytes 240 200 - 950 cells/uL   Eosinophils Absolute 130 15 - 500 cells/uL   Basophils Absolute 40 0 - 200 cells/uL   Neutrophils Relative % 55.1 %   Total Lymphocyte 36.7 %   Monocytes Relative 4.8 %   Eosinophils Relative 2.6 %   Basophils Relative 0.8 %  Lipid panel  Result Value Ref Range   Cholesterol 200 (H) <200 mg/dL   HDL 56 > OR = 50 mg/dL   Triglycerides 96 <151 mg/dL   LDL Cholesterol (Calc) 124 (H) mg/dL (calc)   Total CHOL/HDL Ratio 3.6 <5.0 (calc)   Non-HDL  Cholesterol (Calc) 144 (H) <130 mg/dL (calc)  Hemoglobin V6H  Result Value Ref Range   Hgb A1c MFr Bld 5.4 <5.7 % of total Hgb   Mean Plasma Glucose 108 (calc)   eAG (mmol/L) 6.0 (calc)  COMPLETE METABOLIC PANEL WITH GFR  Result Value Ref Range   Glucose, Bld 86 65 - 99 mg/dL   BUN 7 7 - 25 mg/dL   Creat 6.07 3.71 - 0.62 mg/dL   GFR, Est Non African American 99 > OR = 60 mL/min/1.31m2   GFR, Est African American 115 > OR = 60 mL/min/1.54m2   BUN/Creatinine Ratio NOT APPLICABLE 6 - 22 (calc)   Sodium 144 135 - 146 mmol/L   Potassium 4.2 3.5 - 5.3 mmol/L   Chloride 110 98 - 110 mmol/L   CO2 29 20 - 32 mmol/L   Calcium 10.6 (H) 8.6 - 10.4 mg/dL   Total Protein 6.4 6.1 - 8.1 g/dL   Albumin 4.2 3.6 - 5.1 g/dL   Globulin 2.2 1.9 - 3.7 g/dL (calc)   AG Ratio 1.9 1.0 - 2.5 (calc)   Total Bilirubin 0.4 0.2 - 1.2 mg/dL   Alkaline phosphatase (APISO) 90 37 - 153 U/L   AST 11 10 - 35 U/L   ALT 12 6 - 29 U/L    I have personally reviewed the radiology report from 10/31/20 CT from [facility].  CT Chest Wo Contrast  Anatomical Region Laterality Modality  Chest -- Computed Tomography    Impression   1. Relative to CT imaging in February and March 2022, there has been near resolution of presumably infection related consolidative airspace disease in the right lung. At this point, only a small focus of consolidative-like opacification is evident within the right lower lobe (image 55 series 3).   2. Again noted is post infectious architectural distortion with scarring and coarse groundglass opacities in the right lung.   3. Notable incidental findings include:  -Small hiatal hernia  Narrative  EXAM: CT CHEST WO CONTRAST  DATE: 10/31/2020 1:32 PM  ACCESSION: [id number]  DICTATED: 10/31/2020 2:34 PM   CLINICAL INDICATION: 70 years old Female with SOB  - J18.9 - Pneumonia due to infectious organism, unspecified laterality, unspecified part of lung     COMPARISON: Multiple prior CT scans of  the chest, the most recent from September 01, 2020   TECHNIQUE:  A spiral CT scan was obtained without IV contrast from the thoracic inlet through the hemidiaphragms. Images were reconstructed in the axial plane.  Coronal and sagittal reformatted images of the chest were also provided for further evaluation of the lung parenchyma.   FINDINGS:   THORACIC INLET:  -No prominent lymph nodes  -Coarse calcifications are evident in the left lobe of the thyroid gland   MEDIASTINUM/HILA/AXILLAE:  -No prominent lymph nodes within the mediastinum  -No prominent lymphoid tissue within the pulmonary hila  -No prominent lymph nodes within the axilla   CARDIOVASCULAR:  -Heart chambers are normal in size  -No pericardial fluid  -Aorta is normal in caliber  -Pulmonary artery in normal in caliber   LUNGS AND AIRWAYS:  -Minimal residual consolidation within the right lung (image 55 series 3)  -Persistent architectural distortion with linear bands of scarring and coarse groundglass opacities in all 3 lobes of the right lung  -Similar poorly defined 6 mm nodule in the lingula of the left lung (image 51 series 3)  -Small focus of centrilobular micronodularity is again evident within the lateral basilar segment of the lower lobe of the right lung (image 57 series 3)   PLEURA: No accumulation of gas or fluid within the pleural spaces   IMAGED ABDOMEN: Unremarkable imaging of the cephalad abdomen   CHEST WALL/SKELETON:  -No mass lesions  -No suspicious skeletal findings  Procedure Note  Delsa Grana, MD - 10/31/2020  Formatting of this note might be different from the original.  EXAM: CT CHEST WO CONTRAST  DATE: 10/31/2020 1:32 PM  ACCESSION: [id number]  DICTATED: 10/31/2020 2:34 PM   CLINICAL INDICATION: 70 years old Female with SOB  - J18.9 - Pneumonia due to infectious organism, unspecified laterality, unspecified part of lung     COMPARISON: Multiple prior CT scans of the chest, the most  recent from September 01, 2020   TECHNIQUE: A spiral CT scan was obtained without IV contrast from the thoracic inlet through the hemidiaphragms. Images were reconstructed in the axial plane.  Coronal and sagittal reformatted images of the chest were also provided for further evaluation of the lung parenchyma.   FINDINGS:   THORACIC INLET:  -No prominent lymph nodes  -Coarse calcifications are evident in the left lobe of the thyroid gland   MEDIASTINUM/HILA/AXILLAE:  -No prominent lymph nodes within the mediastinum  -No prominent lymphoid tissue within the pulmonary hila  -No prominent lymph nodes within the axilla   CARDIOVASCULAR:  -Heart chambers are normal in size  -No pericardial fluid  -Aorta is normal in caliber  -Pulmonary artery in normal in caliber   LUNGS AND AIRWAYS:  -Minimal residual consolidation within the right lung (image 55 series 3)  -Persistent architectural distortion with linear bands of scarring and coarse groundglass opacities in all 3 lobes of the right lung  -Similar poorly defined 6 mm nodule in the lingula of the left lung (image 51 series 3)  -Small focus of centrilobular micronodularity is again evident within the lateral basilar segment of the lower lobe of the right lung (image 57 series 3)   PLEURA: No accumulation of gas or fluid within the pleural spaces   IMAGED ABDOMEN: Unremarkable imaging of the cephalad abdomen   CHEST WALL/SKELETON:  -No mass lesions  -No suspicious skeletal findings    IMPRESSION:   1. Relative to CT imaging in February and March 2022, there has been near resolution of presumably infection related consolidative airspace disease in the right lung.  At this point, only a small focus of consolidative-like opacification is evident within the right lower lobe (image 55 series 3).   2. Again noted is post infectious architectural distortion with scarring and coarse groundglass opacities in the right lung.   3. Notable  incidental findings include:  -Small hiatal hernia Exam End: 10/31/20 13:32   Specimen Collected: 10/31/20 14:34 Last Resulted: 10/31/20 15:03  [facility]  Result Received: 12/04/20 15:12       Assessment & Plan:   Problem List Items Addressed This Visit   None Visit Diagnoses     Bronchiectasis without complication (HCC)    -  Primary        Bronchiectasis Hx Multifocal pneumonia - resolved, not active.   [facility] Eliberto IvoryAustin [facility] 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 May benefit from maintenance inhaler therapy  Still Gradual improvement Continues on cough suppression PRN.  Proceed with Return to work date full duty 01/22/21 as per prior note documentation FMLA.  No orders of the defined types were placed in this encounter.     Follow up plan: Return in about 3 months (around 03/06/2021) for 3 month fasting lab only then 1 week later Annual Physical.  Future labs ordered for 02/2021  Saralyn PilarAlexander Edna Rede, DO [facility] 12/04/2020, 3:41 PM

## 2020-12-05 ENCOUNTER — Other Ambulatory Visit: Payer: Self-pay | Admitting: Family Medicine

## 2020-12-05 DIAGNOSIS — R7309 Other abnormal glucose: Secondary | ICD-10-CM

## 2020-12-05 DIAGNOSIS — R7989 Other specified abnormal findings of blood chemistry: Secondary | ICD-10-CM

## 2020-12-05 DIAGNOSIS — E559 Vitamin D deficiency, unspecified: Secondary | ICD-10-CM

## 2020-12-05 DIAGNOSIS — Z0000 Encounter for general adult medical examination without abnormal findings: Secondary | ICD-10-CM

## 2020-12-05 DIAGNOSIS — E7800 Pure hypercholesterolemia, unspecified: Secondary | ICD-10-CM

## 2020-12-25 ENCOUNTER — Telehealth: Payer: Self-pay | Admitting: Family Medicine

## 2020-12-25 NOTE — Telephone Encounter (Signed)
Pt is suppose to return to work on August 8th from having pneumonia  and needs a letter sent to her daughters email / email is [email or web address](the T is capital) / Pt has been out since Feb 26th /please advise

## 2020-12-28 NOTE — Telephone Encounter (Signed)
I can write the letter for her return to work on 01/22/21, however we typically don't send patient documents to personal email addresses. Only option to do that is to print the letter from her chart and scan it and email it - however our office manager is only one in office who can scan to computer to email a document.  If she is not returning until 01/22/21, could they come by to pick up hand signed copy of letter and they can turn it in or email it whenever they are ready?  Let me know. Thanks  Saralyn Pilar, DO [facility] 12/28/2020, 8:10 AM

## 2020-12-28 NOTE — Telephone Encounter (Signed)
Pt states she also needs the return to work letter to state she needs light duty for a month.

## 2020-12-28 NOTE — Telephone Encounter (Signed)
Return to work letter has been written, printed, and signed.  I spoke with patient and included a light duty lifting restriction starting 01/22/21 with avoid heavy lifting >3 lbs for 1 month, restriction ends 02/19/21.   She may pick up signed letter when she is ready. It will be in my outbox at my desk and will be kept up in the front office.  Saralyn Pilar, DO [facility] 12/28/2020, 12:34 PM

## 2021-03-07 ENCOUNTER — Other Ambulatory Visit: Payer: 59

## 2021-03-08 ENCOUNTER — Other Ambulatory Visit: Payer: Self-pay

## 2021-03-08 DIAGNOSIS — Z0000 Encounter for general adult medical examination without abnormal findings: Secondary | ICD-10-CM

## 2021-03-08 DIAGNOSIS — E7800 Pure hypercholesterolemia, unspecified: Secondary | ICD-10-CM

## 2021-03-08 DIAGNOSIS — R7309 Other abnormal glucose: Secondary | ICD-10-CM

## 2021-03-08 DIAGNOSIS — R7989 Other specified abnormal findings of blood chemistry: Secondary | ICD-10-CM

## 2021-03-08 DIAGNOSIS — E559 Vitamin D deficiency, unspecified: Secondary | ICD-10-CM

## 2021-03-09 ENCOUNTER — Other Ambulatory Visit: Payer: 59

## 2021-03-09 ENCOUNTER — Other Ambulatory Visit: Payer: Self-pay

## 2021-03-10 LAB — LIPID PANEL
Cholesterol: 201 mg/dL — ABNORMAL HIGH (ref <200)
HDL: 58 mg/dL (ref >=50)
LDL Cholesterol (Calc): 125 mg/dL (calc) — ABNORMAL HIGH
Non-HDL Cholesterol (Calc): 143 mg/dL (calc) — ABNORMAL HIGH (ref <130)
Total CHOL/HDL Ratio: 3.5 (calc) (ref <5.0)
Triglycerides: 79 mg/dL (ref <150)

## 2021-03-10 LAB — CBC WITH DIFFERENTIAL/PLATELET
Absolute Monocytes: 250 cells/uL (ref 200–950)
Basophils Absolute: 60 cells/uL (ref 0–200)
Basophils Relative: 1.2 %
Eosinophils Absolute: 90 cells/uL (ref 15–500)
Eosinophils Relative: 1.8 %
HCT: 35.7 % (ref 35.0–45.0)
Hemoglobin: 11.5 g/dL — ABNORMAL LOW (ref 11.7–15.5)
Lymphs Abs: 1685 cells/uL (ref 850–3900)
MCH: 26.3 pg — ABNORMAL LOW (ref 27.0–33.0)
MCHC: 32.2 g/dL (ref 32.0–36.0)
MCV: 81.5 fL (ref 80.0–100.0)
MPV: 10.9 fL (ref 7.5–12.5)
Monocytes Relative: 5 %
Neutro Abs: 2915 cells/uL (ref 1500–7800)
Neutrophils Relative %: 58.3 %
Platelets: 287 10*3/uL (ref 140–400)
RBC: 4.38 10*6/uL (ref 3.80–5.10)
RDW: 13.9 % (ref 11.0–15.0)
Total Lymphocyte: 33.7 %
WBC: 5 10*3/uL (ref 3.8–10.8)

## 2021-03-10 LAB — TSH: TSH: 2.32 mIU/L (ref 0.40–4.50)

## 2021-03-10 LAB — COMPLETE METABOLIC PANEL WITH GFR
AG Ratio: 1.8 (calc) (ref 1.0–2.5)
ALT: 12 U/L (ref 6–29)
AST: 14 U/L (ref 10–35)
Albumin: 4.3 g/dL (ref 3.6–5.1)
Alkaline phosphatase (APISO): 92 U/L (ref 37–153)
BUN: 9 mg/dL (ref 7–25)
CO2: 24 mmol/L (ref 20–32)
Calcium: 10.7 mg/dL — ABNORMAL HIGH (ref 8.6–10.4)
Chloride: 109 mmol/L (ref 98–110)
Creat: 0.62 mg/dL (ref 0.50–1.05)
Globulin: 2.4 g/dL (calc) (ref 1.9–3.7)
Glucose, Bld: 97 mg/dL (ref 65–99)
Potassium: 4 mmol/L (ref 3.5–5.3)
Sodium: 142 mmol/L (ref 135–146)
Total Bilirubin: 0.3 mg/dL (ref 0.2–1.2)
Total Protein: 6.7 g/dL (ref 6.1–8.1)
eGFR: 96 mL/min/{1.73_m2} (ref >=60)

## 2021-03-10 LAB — HEMOGLOBIN A1C
Hgb A1c MFr Bld: 5.4 % of total Hgb (ref <5.7)
Mean Plasma Glucose: 108 mg/dL
eAG (mmol/L): 6 mmol/L

## 2021-03-10 LAB — VITAMIN D 25 HYDROXY (VIT D DEFICIENCY, FRACTURES): Vit D, 25-Hydroxy: 34 ng/mL (ref 30–100)

## 2021-03-14 ENCOUNTER — Other Ambulatory Visit: Payer: Self-pay

## 2021-03-14 ENCOUNTER — Ambulatory Visit (INDEPENDENT_AMBULATORY_CARE_PROVIDER_SITE_OTHER): Payer: 59 | Admitting: Family Medicine

## 2021-03-14 ENCOUNTER — Other Ambulatory Visit: Payer: Self-pay | Admitting: Family Medicine

## 2021-03-14 ENCOUNTER — Encounter: Payer: Self-pay | Admitting: Family Medicine

## 2021-03-14 VITALS — BP 126/86 | HR 80 | Ht 65.0 in | Wt 161.2 lb

## 2021-03-14 DIAGNOSIS — Z1231 Encounter for screening mammogram for malignant neoplasm of breast: Secondary | ICD-10-CM

## 2021-03-14 DIAGNOSIS — D649 Anemia, unspecified: Secondary | ICD-10-CM

## 2021-03-14 DIAGNOSIS — Z23 Encounter for immunization: Secondary | ICD-10-CM

## 2021-03-14 DIAGNOSIS — Z0000 Encounter for general adult medical examination without abnormal findings: Secondary | ICD-10-CM

## 2021-03-14 DIAGNOSIS — J479 Bronchiectasis, uncomplicated: Secondary | ICD-10-CM

## 2021-03-14 DIAGNOSIS — E559 Vitamin D deficiency, unspecified: Secondary | ICD-10-CM

## 2021-03-14 DIAGNOSIS — J9801 Acute bronchospasm: Secondary | ICD-10-CM

## 2021-03-14 DIAGNOSIS — J189 Pneumonia, unspecified organism: Secondary | ICD-10-CM

## 2021-03-14 MED ORDER — MONTELUKAST SODIUM 10 MG PO TABS: 10.0000 mg | ORAL_TABLET | Freq: Every day | ORAL | 3 refills | Status: DC

## 2021-03-14 MED ORDER — BENZONATATE 100 MG PO CAPS: 100.0000 mg | ORAL_CAPSULE | Freq: Three times a day (TID) | ORAL | 3 refills | Status: DC | PRN

## 2021-03-14 MED ORDER — ALBUTEROL SULFATE HFA 108 (90 BASE) MCG/ACT IN AERS: 2.0000 | INHALATION_SPRAY | RESPIRATORY_TRACT | 5 refills | Status: DC | PRN

## 2021-03-14 NOTE — Progress Notes (Signed)
Subjective:    Patient ID: Sophia Schmidt, female    DOB: August 13, 1950, 70 y.o.   MRN: [id number]  Sophia Schmidt is a 70 y.o. female presenting on 03/14/2021 for Annual Exam and Asthma   HPI  Here for Annual Physical and Lab Review  Hypercalcemia On lab recent trend persistent.  Vitamin D Deficiency Previously low, has been treated, now taking Vit D 1k 3 times a week.  History Multifocal Pneumonia / Bronchiectasis Chronic Dyspnea Interval update, last seen [facility] Dr Thad Ranger on 12/15/20, advised that likely gradual improvement in dyspnea over time. She has recovered from her PNA on clinical evaluation and on imaging.  Currently has episodic coughing spells in PM now.  HYPERLIPIDEMIA: - Reports no concerns. Last lipid panel 02/2021 still mild elevated - Not taking a Statin or cholesterol medication - She is taking ASA 75m daily some missed doses   FOLLOW-UP GERD Prior history of GERD and Dysphagia. History of EGD Colonoscopy, see prior note - Currently doing well, taking Pantoprazole 437mQOD     Health Maintenance:   Colon CA Screening: Last Colonoscopy (done by Dr [facility]ic[facility]) on 12/13/16, results with no polyps, good for 5 years. Currently asymptomatic. No known family history of colon CA. UTD - next due 11/2021   - Breast CA Screening: Due for mammogram screening. Last mammogram 02/2020 - And history of prior abnormal results with abnormal mass/nodule with fibrocystic masses s/p lumpectomy L 1998. Known family history of breast cancer sister age 5914Currently asymptomatic.   - Due for DEXA BMD - no known family or personal history of osteoporosis. No prior fragility fractures. No prior Vitamin D result. DECLINED   [facility] 12/04/2020 10/23/2020 01/07/2020  Decreased Interest 0 3 0  Down, Depressed, Hopeless 2 3 0  PHQ - 2 Score 2 6 0  Altered sleeping 2 0 -  Tired, decreased energy 2 3 -  Change in appetite 3 - -  Feeling bad or failure about  yourself  3 0 -  Trouble concentrating 0 0 -  Moving slowly or fidgety/restless 3 0 -  Suicidal thoughts 1 0 -  PHQ-9 Score 16 9 -  Difficult doing work/chores Extremely dIfficult Not difficult at all -    Past Medical History:  Diagnosis Date   Anemia    GERD (gastroesophageal reflux disease)    Past Surgical History:  Procedure Laterality Date   ABDOMINAL HYSTERECTOMY  1998   partial   BREAST CYST ASPIRATION Left 10/17/2014   FNA done by Dr. SaJamal Collin BREAST EXCISIONAL BIOPSY Left 1998   x 2   BREAST SURGERY Left 1998   removal of 2 fibrocystic masses   COLONOSCOPY  2011   COLONOSCOPY WITH PROPOFOL N/A 12/13/2016   Procedure: COLONOSCOPY WITH PROPOFOL;  Surgeon: AnJonathon BellowsMD;  [facility];  Service: Endoscopy;  Laterality: N/A;   ESOPHAGOGASTRODUODENOSCOPY (EGD) WITH PROPOFOL N/A 12/13/2016   Procedure: ESOPHAGOGASTRODUODENOSCOPY (EGD) WITH PROPOFOL;  Surgeon: AnJonathon BellowsMD;  [facility];  Service: Endoscopy;  Laterality: N/A;   TONSILLECTOMY  1996   Social History   Socioeconomic History   Marital status: Divorced    Spouse name: Not on file   Number of children: Not on file   Years of education: Not on file   Highest education level: Not on file  Occupational History   Not on file  Tobacco Use   Smoking status: Never   Smokeless tobacco: Never  Vaping  Use   Vaping Use: Never used  Substance and Sexual Activity   Alcohol use: No    Alcohol/week: 0.0 standard drinks   Drug use: No   Sexual activity: Yes  Other Topics Concern   Not on file  Social History Narrative   Not on file   Social Determinants of Health   Financial Resource Strain: Not on file  Food Insecurity: Not on file  Transportation Needs: Not on file  Physical Activity: Not on file  Stress: Not on file  Social Connections: Not on file  Intimate Partner Violence: Not on file   Family History  Problem Relation Age of Onset   Cancer Mother        pancreatic    Alzheimer's disease Father    Cancer Sister        lung   Cancer Brother        lung   Breast cancer Sister        8   Colon cancer Neg Hx    Current Outpatient Medications on File Prior to Visit  Medication Sig   aspirin EC 81 MG tablet Take 81 mg by mouth daily.   ferrous sulfate 325 (65 FE) MG tablet Take 1 tablet by mouth daily.   pantoprazole (PROTONIX) 40 MG tablet Take 40 mg by mouth every other day.   No current facility-administered medications on file prior to visit.    Review of Systems  Constitutional:  Negative for activity change, appetite change, chills, diaphoresis, fatigue and fever.  HENT:  Negative for congestion and hearing loss.   Eyes:  Negative for visual disturbance.  Respiratory:  Negative for cough, chest tightness, shortness of breath and wheezing.   Cardiovascular:  Negative for chest pain, palpitations and leg swelling.  Gastrointestinal:  Negative for abdominal pain, constipation, diarrhea, nausea and vomiting.  Genitourinary:  Negative for dysuria, frequency and hematuria.  Musculoskeletal:  Negative for arthralgias and neck pain.  Skin:  Negative for rash.  Neurological:  Negative for dizziness, weakness, light-headedness, numbness and headaches.  Hematological:  Negative for adenopathy.  Psychiatric/Behavioral:  Negative for behavioral problems, dysphoric mood and sleep disturbance.   Per HPI unless specifically indicated above     Objective:    BP 126/86 (BP Location: Left Arm, Cuff Size: Normal)   Pulse 80   Ht '5\' 5"'  (1.651 m)   Wt 161 lb 3.2 oz (73.1 kg)   SpO2 99%   BMI 26.83 kg/m   Wt Readings from Last 3 Encounters:  03/14/21 161 lb 3.2 oz (73.1 kg)  12/04/20 161 lb 3.2 oz (73.1 kg)  10/23/20 153 lb (69.4 kg)    Physical Exam Vitals and nursing note reviewed.  Constitutional:      General: She is not in acute distress.    Appearance: She is well-developed. She is not diaphoretic.     Comments: Well-appearing, comfortable,  cooperative  HENT:     Head: Normocephalic and atraumatic.  Eyes:     General:        Right eye: No discharge.        Left eye: No discharge.     Conjunctiva/sclera: Conjunctivae normal.     Pupils: Pupils are equal, round, and reactive to light.  Neck:     Thyroid: No thyromegaly.  Cardiovascular:     Rate and Rhythm: Normal rate and regular rhythm.     Pulses: Normal pulses.     Heart sounds: Normal heart sounds. No murmur heard. Pulmonary:  Effort: Pulmonary effort is normal. No respiratory distress.     Breath sounds: Normal breath sounds. No wheezing or rales.  Abdominal:     General: Bowel sounds are normal. There is no distension.     Palpations: Abdomen is soft. There is no mass.     Tenderness: There is no abdominal tenderness.  Musculoskeletal:        General: No tenderness. Normal range of motion.     Cervical back: Normal range of motion and neck supple.     Comments: Upper / Lower Extremities: - Normal muscle tone, strength bilateral upper extremities 5/5, lower extremities 5/5  Lymphadenopathy:     Cervical: No cervical adenopathy.  Skin:    General: Skin is warm and dry.     Findings: No erythema or rash.  Neurological:     Mental Status: She is alert and oriented to person, place, and time.     Comments: Distal sensation intact to light touch all extremities  Psychiatric:        Mood and Affect: Mood normal.        Behavior: Behavior normal.        Thought Content: Thought content normal.     Comments: Well groomed, good eye contact, normal speech and thoughts   Results for orders placed or performed in visit on 03/08/21  TSH  Result Value Ref Range   TSH 2.32 0.40 - 4.50 mIU/L  VITAMIN D 25 Hydroxy (Vit-D Deficiency, Fractures)  Result Value Ref Range   Vit D, 25-Hydroxy 34 30 - 100 ng/mL  Lipid panel  Result Value Ref Range   Cholesterol 201 (H) <200 mg/dL   HDL 58 > OR = 50 mg/dL   Triglycerides 79 <150 mg/dL   LDL Cholesterol (Calc) 125 (H)  mg/dL (calc)   Total CHOL/HDL Ratio 3.5 <5.0 (calc)   Non-HDL Cholesterol (Calc) 143 (H) <130 mg/dL (calc)  COMPLETE METABOLIC PANEL WITH GFR  Result Value Ref Range   Glucose, Bld 97 65 - 99 mg/dL   BUN 9 7 - 25 mg/dL   Creat 0.62 0.50 - 1.05 mg/dL   eGFR 96 > OR = 60 mL/min/1.85m   BUN/Creatinine Ratio NOT APPLICABLE 6 - 22 (calc)   Sodium 142 135 - 146 mmol/L   Potassium 4.0 3.5 - 5.3 mmol/L   Chloride 109 98 - 110 mmol/L   CO2 24 20 - 32 mmol/L   Calcium 10.7 (H) 8.6 - 10.4 mg/dL   Total Protein 6.7 6.1 - 8.1 g/dL   Albumin 4.3 3.6 - 5.1 g/dL   Globulin 2.4 1.9 - 3.7 g/dL (calc)   AG Ratio 1.8 1.0 - 2.5 (calc)   Total Bilirubin 0.3 0.2 - 1.2 mg/dL   Alkaline phosphatase (APISO) 92 37 - 153 U/L   AST 14 10 - 35 U/L   ALT 12 6 - 29 U/L  CBC with Differential/Platelet  Result Value Ref Range   WBC 5.0 3.8 - 10.8 Thousand/uL   RBC 4.38 3.80 - 5.10 Million/uL   Hemoglobin 11.5 (L) 11.7 - 15.5 g/dL   HCT 35.7 35.0 - 45.0 %   MCV 81.5 80.0 - 100.0 fL   MCH 26.3 (L) 27.0 - 33.0 pg   MCHC 32.2 32.0 - 36.0 g/dL   RDW 13.9 11.0 - 15.0 %   Platelets 287 140 - 400 Thousand/uL   MPV 10.9 7.5 - 12.5 fL   Neutro Abs 2,915 1,500 - 7,800 cells/uL   Lymphs Abs 1,685 850 -  3,900 cells/uL   Absolute Monocytes 250 200 - 950 cells/uL   Eosinophils Absolute 90 15 - 500 cells/uL   Basophils Absolute 60 0 - 200 cells/uL   Neutrophils Relative % 58.3 %   Total Lymphocyte 33.7 %   Monocytes Relative 5.0 %   Eosinophils Relative 1.8 %   Basophils Relative 1.2 %  Hemoglobin A1c  Result Value Ref Range   Hgb A1c MFr Bld 5.4 <5.7 % of total Hgb   Mean Plasma Glucose 108 mg/dL   eAG (mmol/L) 6.0 mmol/L      Assessment & Plan:   Problem List Items Addressed This Visit     Breast cancer screening   Relevant Orders   MM 3D SCREEN BREAST BILATERAL   Other Visit Diagnoses     Annual physical exam    -  Primary   Needs flu shot       Relevant Orders   Flu Vaccine QUAD High Dose(Fluad)  (Completed)       Updated Health Maintenance information Flu shot today Future COVID Booster when ready Reviewed recent lab results with patient Encouraged improvement to lifestyle with diet and exercise  Mammogram ordered, patient to schedule.  Hypercalcemia will follow-up repeat labs work up in 6 months as scheduled. - Will stop Vitamin D at this time, only on TIW  Chronic Cough/Dyspnea Will add Singulair nightly Continue albuterol PRN, Tessalon PRN F/u with [facility] again if worsening or new concern, otherwise anticipate gradual resolution   No orders of the defined types were placed in this encounter.     Follow up plan: Return in about 6 months (around 09/11/2021) for 6 month fasting lab only then 1 week later Follow-up Calcium/VitD, Breathing..  Future check Calcium, Vitamin D, PTH in 6 months.   Sophia Schmidt, [facility] 03/14/2021, 3:48 PM

## 2021-03-14 NOTE — Patient Instructions (Addendum)
Thank you for coming to the office today.  Stop Vitamin D for now, result was good and we want to lower Calcium  Limit Calcium in diet.  We can consider a new blood test next time in the next 6 months.  For Mammogram screening for breast cancer   Call the Imaging Center below anytime to schedule your own appointment now that order has been placed.  [facility] [address] [place], [place] [address] Phone: [phone]  Start Singulair (montelukast) 10mg  nightly new allergy/breathing pill to help. Refilled other meds.   DUE for FASTING BLOOD WORK (no food or drink after midnight before the lab appointment, only water or coffee without cream/sugar on the morning of)  SCHEDULE "Lab Only" visit in the morning at the clinic for lab draw in  6 MONTHS   - Make sure Lab Only appointment is at about 1 week before your next appointment, so that results will be available  For Lab Results, once available within 2-3 days of blood draw, you can can log in to MyChart online to view your results and a brief explanation. Also, we can discuss results at next follow-up visit.   Please schedule a Follow-up Appointment to: Return in about 6 months (around 09/11/2021) for 6 month fasting lab only then 1 week later Follow-up Calcium/VitD, Breathing..  If you have any other questions or concerns, please feel free to call the office or send a message through MyChart. You may also schedule an earlier appointment if necessary.  Additionally, you may be receiving a survey about your experience at our office within a few days to 1 week by e-mail or mail. We value your feedback.  09/13/2021, DO [facility], [facility]

## 2021-04-06 ENCOUNTER — Other Ambulatory Visit: Payer: Self-pay

## 2021-04-06 ENCOUNTER — Ambulatory Visit
Admission: RE | Admit: 2021-04-06 | Discharge: 2021-04-06 | Disposition: A | Discharge status: Home / Self Care | Payer: No Typology Code available for payment source | Source: Ambulatory Visit | Attending: Family Medicine | Admitting: Family Medicine

## 2021-04-06 DIAGNOSIS — Z1231 Encounter for screening mammogram for malignant neoplasm of breast: Secondary | ICD-10-CM | POA: Insufficient documentation

## 2021-05-03 ENCOUNTER — Ambulatory Visit: Payer: 59 | Admitting: Family Medicine

## 2021-05-03 ENCOUNTER — Other Ambulatory Visit: Payer: Self-pay

## 2021-05-03 ENCOUNTER — Encounter: Payer: Self-pay | Admitting: Family Medicine

## 2021-05-03 VITALS — BP 152/77 | HR 85 | Ht 65.0 in | Wt 159.2 lb

## 2021-05-03 DIAGNOSIS — J0110 Acute frontal sinusitis, unspecified: Secondary | ICD-10-CM | POA: Diagnosis not present

## 2021-05-03 DIAGNOSIS — J9801 Acute bronchospasm: Secondary | ICD-10-CM | POA: Diagnosis not present

## 2021-05-03 DIAGNOSIS — J479 Bronchiectasis, uncomplicated: Secondary | ICD-10-CM | POA: Diagnosis not present

## 2021-05-03 MED ORDER — IPRATROPIUM BROMIDE 0.06 % NA SOLN: 2.0000 | Freq: Four times a day (QID) | NASAL | 0 refills | Status: DC

## 2021-05-03 MED ORDER — PREDNISONE 20 MG PO TABS: ORAL_TABLET | ORAL | 0 refills | Status: DC

## 2021-05-03 MED ORDER — GUAIFENESIN-CODEINE 100-10 MG/5ML PO SYRP
5.0000 mL | ORAL_SOLUTION | Freq: Four times a day (QID) | ORAL | 0 refills | Status: DC | PRN
Start: 2021-05-03 — End: 2021-06-21

## 2021-05-03 MED ORDER — LEVOFLOXACIN 500 MG PO TABS: 500.0000 mg | ORAL_TABLET | Freq: Every day | ORAL | 0 refills | Status: DC

## 2021-05-03 NOTE — Patient Instructions (Addendum)
Thank you for coming to the office today.  Start medications antibiotic levaquin, nasal spray, cough medicine, and prednisone taper  Start Atrovent nasal spray decongestant 2 sprays in each nostril up to 4 times daily for 7 days  If not improving or new concerns notify and come back for X-ray  Please schedule a Follow-up Appointment to: Return if symptoms worsen or fail to improve.  If you have any other questions or concerns, please feel free to call the office or send a message through MyChart. You may also schedule an earlier appointment if necessary.  Additionally, you may be receiving a survey about your experience at our office within a few days to 1 week by e-mail or mail. We value your feedback.  Saralyn Pilar, DO [facility], [place]

## 2021-05-03 NOTE — Progress Notes (Signed)
Subjective:    Patient ID: Sophia Schmidt, female    DOB: [birth year], 70 y.o.   MRN: [id number]  Sophia Schmidt is a 70 y.o. female presenting on 05/03/2021 for Cough and Hoarse   HPI  Acute Sinusitis with cough/bronchospasm History Bronchiectasis Symptoms onset with sore throat 1 week ago then hoarse voice laryngitis and cough developed, persistent cough. Thicker phlegm productive No sick contacts. Grandson sick with URI Her oxygen level has been around 97% pulse ox at home History of bronchiectasis with complicated pneumonia in the past. Denies fever or chills, nausea vomiting  UTD Flu Shot and COVID19 booster.  Depression screen [facility] 2/9 12/04/2020 10/23/2020 01/07/2020  Decreased Interest 0 3 0  Down, Depressed, Hopeless 2 3 0  PHQ - 2 Score 2 6 0  Altered sleeping 2 0 -  Tired, decreased energy 2 3 -  Change in appetite 3 - -  Feeling bad or failure about yourself  3 0 -  Trouble concentrating 0 0 -  Moving slowly or fidgety/restless 3 0 -  Suicidal thoughts 1 0 -  PHQ-9 Score 16 9 -  Difficult doing work/chores Extremely dIfficult Not difficult at all -    Social History   Tobacco Use   Smoking status: Never   Smokeless tobacco: Never  Vaping Use   Vaping Use: Never used  Substance Use Topics   Alcohol use: No    Alcohol/week: 0.0 standard drinks   Drug use: No    Review of Systems Per HPI unless specifically indicated above     Objective:    BP (!) 152/77   Pulse 85   Ht _0  (1.651 m)   Wt 159 lb 3.2 oz (72.2 kg)   SpO2 100%   BMI 26.49 kg/m   Wt Readings from Last 3 Encounters:  05/03/21 159 lb 3.2 oz (72.2 kg)  03/14/21 161 lb 3.2 oz (73.1 kg)  12/04/20 161 lb 3.2 oz (73.1 kg)    Physical Exam Vitals and nursing note reviewed.  Constitutional:      General: She is not in acute distress.    Appearance: She is well-developed. She is not diaphoretic.     Comments: Well-appearing, comfortable, cooperative  HENT:     Head: Normocephalic  and atraumatic.  Eyes:     General:        Right eye: No discharge.        Left eye: No discharge.     Conjunctiva/sclera: Conjunctivae normal.  Neck:     Thyroid: No thyromegaly.  Cardiovascular:     Rate and Rhythm: Normal rate and regular rhythm.     Heart sounds: Normal heart sounds. No murmur heard. Pulmonary:     Effort: Pulmonary effort is normal. No respiratory distress.     Breath sounds: Normal breath sounds. No wheezing or rales.  Musculoskeletal:        General: Normal range of motion.     Cervical back: Normal range of motion and neck supple.  Lymphadenopathy:     Cervical: No cervical adenopathy.  Skin:    General: Skin is warm and dry.     Findings: No erythema or rash.  Neurological:     Mental Status: She is alert and oriented to person, place, and time.  Psychiatric:        Behavior: Behavior normal.     Comments: Well groomed, good eye contact, normal speech and thoughts   Results for orders placed or performed in visit  on 03/08/21  TSH  Result Value Ref Range   TSH 2.32 0.40 - 4.50 mIU/L  VITAMIN D 25 Hydroxy (Vit-D Deficiency, Fractures)  Result Value Ref Range   Vit D, 25-Hydroxy 34 30 - 100 ng/mL  Lipid panel  Result Value Ref Range   Cholesterol 201 (H) <200 mg/dL   HDL 58 > OR = 50 mg/dL   Triglycerides 79 <150 mg/dL   LDL Cholesterol (Calc) 125 (H) mg/dL (calc)   Total CHOL/HDL Ratio 3.5 <5.0 (calc)   Non-HDL Cholesterol (Calc) 143 (H) <130 mg/dL (calc)  COMPLETE METABOLIC PANEL WITH GFR  Result Value Ref Range   Glucose, Bld 97 65 - 99 mg/dL   BUN 9 7 - 25 mg/dL   Creat 0.62 0.50 - 1.05 mg/dL   eGFR 96 > OR = 60 mL/min/1.76m   BUN/Creatinine Ratio NOT APPLICABLE 6 - 22 (calc)   Sodium 142 135 - 146 mmol/L   Potassium 4.0 3.5 - 5.3 mmol/L   Chloride 109 98 - 110 mmol/L   CO2 24 20 - 32 mmol/L   Calcium 10.7 (H) 8.6 - 10.4 mg/dL   Total Protein 6.7 6.1 - 8.1 g/dL   Albumin 4.3 3.6 - 5.1 g/dL   Globulin 2.4 1.9 - 3.7 g/dL (calc)   AG  Ratio 1.8 1.0 - 2.5 (calc)   Total Bilirubin 0.3 0.2 - 1.2 mg/dL   Alkaline phosphatase (APISO) 92 37 - 153 U/L   AST 14 10 - 35 U/L   ALT 12 6 - 29 U/L  CBC with Differential/Platelet  Result Value Ref Range   WBC 5.0 3.8 - 10.8 Thousand/uL   RBC 4.38 3.80 - 5.10 Million/uL   Hemoglobin 11.5 (L) 11.7 - 15.5 g/dL   HCT 35.7 35.0 - 45.0 %   MCV 81.5 80.0 - 100.0 fL   MCH 26.3 (L) 27.0 - 33.0 pg   MCHC 32.2 32.0 - 36.0 g/dL   RDW 13.9 11.0 - 15.0 %   Platelets 287 140 - 400 Thousand/uL   MPV 10.9 7.5 - 12.5 fL   Neutro Abs 2,915 1,500 - 7,800 cells/uL   Lymphs Abs 1,685 850 - 3,900 cells/uL   Absolute Monocytes 250 200 - 950 cells/uL   Eosinophils Absolute 90 15 - 500 cells/uL   Basophils Absolute 60 0 - 200 cells/uL   Neutrophils Relative % 58.3 %   Total Lymphocyte 33.7 %   Monocytes Relative 5.0 %   Eosinophils Relative 1.8 %   Basophils Relative 1.2 %  Hemoglobin A1c  Result Value Ref Range   Hgb A1c MFr Bld 5.4 <5.7 % of total Hgb   Mean Plasma Glucose 108 mg/dL   eAG (mmol/L) 6.0 mmol/L      Assessment & Plan:   Problem List Items Addressed This Visit   None Visit Diagnoses     Acute non-recurrent frontal sinusitis    -  Primary   Relevant Medications   levofloxacin (LEVAQUIN) 500 MG tablet   predniSONE (DELTASONE) 20 MG tablet   guaiFENesin-codeine (ROBITUSSIN AC) 100-10 MG/5ML syrup   ipratropium (ATROVENT) 0.06 % nasal spray   Cough due to bronchospasm       Relevant Medications   predniSONE (DELTASONE) 20 MG tablet   guaiFENesin-codeine (ROBITUSSIN AC) 100-10 MG/5ML syrup   Bronchiectasis without complication (HCC)           Consistent with acute frontal sinusitis, likely initially viral URI now with second sickening with worsening concern for bacterial infection.   Known  complicated history with prior bronchiectasis pneumonia post covid  Plan: Start taking Levaquin antibiotic 575m daily x 7 days Start Prednisone taper 7 days Start Atrovent nasal  spray decongestant 2 sprays in each nostril up to 4 times daily for 7 days Codeine cough syrup PRN Return criteria reviewed  Defer X-ray today, lungs clear and no other clinical criteria for pneumonia will treat currently and return 1-2 weeks if not improved can pursue X-ray   Meds ordered this encounter  Medications   levofloxacin (LEVAQUIN) 500 MG tablet    Sig: Take 1 tablet (500 mg total) by mouth daily. For 7 days    Dispense:  7 tablet    Refill:  0   predniSONE (DELTASONE) 20 MG tablet    Sig: Take daily with food. Start with 636m(3 pills) x 2 days, then reduce to 4058m2 pills) x 2 days, then 21m31m pill) x 3 days    Dispense:  13 tablet    Refill:  0   guaiFENesin-codeine (ROBITUSSIN AC) 100-10 MG/5ML syrup    Sig: Take 5-10 mLs by mouth 4 (four) times daily as needed for cough.    Dispense:  200 mL    Refill:  0   ipratropium (ATROVENT) 0.06 % nasal spray    Sig: Place 2 sprays into both nostrils 4 (four) times daily. For up to 5-7 days then stop.    Dispense:  15 mL    Refill:  0      Follow up plan: Return if symptoms worsen or fail to improve.   AlexNobie Putnam [facility] 05/03/2021, 8:21 AM

## 2021-06-21 ENCOUNTER — Telehealth: Payer: Self-pay

## 2021-06-21 DIAGNOSIS — J9801 Acute bronchospasm: Secondary | ICD-10-CM

## 2021-06-21 MED ORDER — GUAIFENESIN-CODEINE 100-10 MG/5ML PO SYRP: 5.0000 mL | ORAL_SOLUTION | Freq: Four times a day (QID) | ORAL | 0 refills | Status: DC | PRN

## 2021-06-21 NOTE — Telephone Encounter (Signed)
Okay re ordered cough syrup.  If there was something else she was asking for instead, let me know or she can ask on Monday  Saralyn Pilar, DO [facility] 06/21/2021, 2:15 PM

## 2021-06-21 NOTE — Telephone Encounter (Signed)
Copied from CRM [id number]. Topic: General - Inquiry >> Jun 21, 2021 11:54 AM Daphine Deutscher D wrote: Reason for CRM:  Pt called saying she has a cough again and wants to know if Dr. Kirtland Bouchard will just send something to the pharmacy for her.  She has an appt Monday for her foot. But wants something for over the weekend. [facility] [address]  CB#  [phone]

## 2021-06-25 ENCOUNTER — Encounter: Payer: Self-pay | Admitting: Family Medicine

## 2021-06-25 ENCOUNTER — Other Ambulatory Visit: Payer: Self-pay

## 2021-06-25 ENCOUNTER — Ambulatory Visit: Payer: 59 | Admitting: Family Medicine

## 2021-06-25 VITALS — BP 129/83 | HR 77 | Ht 65.0 in | Wt 156.6 lb

## 2021-06-25 DIAGNOSIS — Q6672 Congenital pes cavus, left foot: Secondary | ICD-10-CM | POA: Diagnosis not present

## 2021-06-25 DIAGNOSIS — Q6671 Congenital pes cavus, right foot: Secondary | ICD-10-CM

## 2021-06-25 DIAGNOSIS — Z23 Encounter for immunization: Secondary | ICD-10-CM

## 2021-06-25 DIAGNOSIS — M722 Plantar fascial fibromatosis: Secondary | ICD-10-CM | POA: Diagnosis not present

## 2021-06-25 MED ORDER — NAPROXEN 500 MG PO TABS: 500.0000 mg | ORAL_TABLET | Freq: Two times a day (BID) | ORAL | 1 refills | Status: DC

## 2021-06-25 NOTE — Patient Instructions (Addendum)
Thank you for coming to the office today.  You most likely have Plantar Fasciitis of heel / foot. - This is inflammation of the fibrous connection on the bottom of the foot, and can have small micro tears over time that become painful. It usually will have flare ups lasting days to weeks, and may come back after it heals if it is re-aggravated again. - Often there is a bone spur or arthritis of the heel bone that causes this - Also it may be caused by abnormal footwear, walking pattern or other problems  If you are experiencing an acute flare with pain, this is usually worst first thing in the morning when the plantar fascia is tight and stiff. First step out of bed is painful usually, and it may gradually improve with stretching and walking.  Recommend trial of Anti-inflammatory with Naproxen (Naprosyn) 500mg  tabs - take one with food and plenty of water TWICE daily every day (breakfast and dinner), for next 1 to 2 weeks, then you may take only as needed - DO NOT TAKE any ibuprofen, aleve, motrin while you are taking this medicine - It is safe to take Tylenol Ext Str 500mg  tabs - take 1 to 2 (max dose 1000mg ) every 6 hours as needed for breakthrough pain, max 24 hour daily dose is 6 to 8 tablets or 4000mg   Recommend: - Rest / relative rest with activity modification avoid overuse / prolonged stand - Ice packs (make sure you use a towel or sock / something to protect skin)  May also try topical muscle rub, icy hot, tiger balm  Start the exercises listed below, gradually increase them as instructed. May be sore at first and hopefully will stretch out and help reduce pain later.  If not improving after 1-2 weeks on medicine, and stretching exercises and some rest - then can contact our office again for re-evaluation may consider other causes or can refer you to Orthopedic specialist to have second opinion, and consider a steroid injection.   Tension Night Splint - can wear at night to help  stretch foot while sleeping. Use occasionally to help.              Plantar Fascia Stretches / Exercises  See other page with pictures of each exercise.  Start with 1 or 2 of these exercises that you are most comfortable with. Do not do any exercises that cause you significant worsening pain. Some of these may cause some "stretching soreness" but it should go away after you stop the exercise, and get better over time. Gradually increase up to 3-4 exercises as tolerated.  You may begin exercising the muscles of your foot right away by gently stretching them as follows:  Stretching: Towel stretch: Sit on a hard surface with your injured leg stretched out in front of you. Loop a towel around the ball of your foot and pull the towel toward your body keeping your knee straight. Hold this position for 15 to 30 seconds then relax. Repeat 3 times. When the towel stretch becomes to easy, you may begin doing the standing calf stretch.  Standing calf stretch: Facing a wall, put your hands against the wall at about eye level. Keep the injured leg back, the uninjured leg forward, and the heel of your injured leg on the floor. Turn your injured foot slightly inward (as if you were pigeon-toed) as you slowly lean into the wall until you feel a stretch in the back of your calf. Hold  for 15 to 30 seconds. Repeat 3 times. Do this exercise several times each day. When you can stand comfortably on your injured foot, you can begin stretching the bottom of your foot using the plantar fascia stretch.  Plantar fascia stretch: Stand with the ball of your injured foot on a stair. Reach for the bottom step with your heel until you feel a stretch in the arch of your foot. Hold this position for 15 to 30 seconds and then relax. Repeat 3 times. After you have stretched the bottom muscles of your foot, you can begin strengthening the top muscles of your foot.  Frozen can roll: Roll your bare injured foot back and  forth from your heel to your mid-arch over a frozen juice can. Repeat for 3 to 5 minutes. This exercise is particularly helpful if done first thing in the morning. Towel pickup: With your heel on the ground, pick up a towel with your toes. Release. Repeat 10 to 20 times. When this gets easy, add more resistance by placing a book or small weight on the towel. Static and dynamic balance exercises Place a chair next to your non-injured leg and stand upright. (This will provide you with balance if needed.) Stand on your injured foot. Try to raise the arch of your foot while keeping your toes on the floor. Try to maintain this position and balance on your injured side for 30 seconds. This exercise can be made more difficult by doing it on a piece of foam or a pillow, or with your eyes closed. Stand in the same position as above. Keep your foot in this position and reach forward in front of you with your injured side's hand, allowing your knee to bend. Repeat this 10 times while maintaining the arch height. This exercise can be made more difficult by reaching farther in front of you. Do 2 sets. Stand in the same position as above. While maintaining your arch height, reach the injured side's hand across your body toward the chair. The farther you reach, the more challenging the exercise. Do 2 sets of 10.  Next, you can begin strengthening the muscles of your foot and lower leg by using elastic tubing.  Strengthening: Resisted dorsiflexion: Sit with your injured leg out straight and your foot facing a doorway. Tie a loop in one end of the tubing. Put your foot through the loop so that the tubing goes around the arch of your foot. Tie a knot in the other end of the tubing and shut the knot in the door. Move backward until there is tension in the tubing. Keeping your knee straight, pull your foot toward your body, stretching the tubing. Slowly return to the starting position. Do 3 sets of 10. Resisted plantar  flexion: Sit with your leg outstretched and loop the middle section of the tubing around the ball of your foot. Hold the ends of the tubing in both hands. Gently press the ball of your foot down and point your toes, stretching the tubing. Return to the starting position. Do 3 sets of 10. Resisted inversion: Sit with your legs out straight and cross your uninjured leg over your injured ankle. Wrap the tubing around the ball of your injured foot and then loop it around your uninjured foot so that the tubing is anchored there at one end. Hold the other end of the tubing in your hand. Turn your injured foot inward and upward. This will stretch the tubing. Return to the starting position.  Do 3 sets of 10. Resisted eversion: Sit with both legs stretched out in front of you, with your feet about a shoulder's width apart. Tie a loop in one end of the tubing. Put your injured foot through the loop so that the tubing goes around the arch of that foot and wraps around the outside of the uninjured foot. Hold onto the other end of the tubing with your hand to provide tension. Turn your injured foot up and out. Make sure you keep your uninjured foot still so that it will allow the tubing to stretch as you move your injured foot. Return to the starting position. Do 3 sets of 10.    Please schedule a Follow-up Appointment to: No follow-ups on file.  If you have any other questions or concerns, please feel free to call the office or send a message through [place]. You may also schedule an earlier appointment if necessary.  Additionally, you may be receiving a survey about your experience at our office within a few days to 1 week by e-mail or mail. We value your feedback.  Nobie Putnam, DO [place]

## 2021-06-25 NOTE — Progress Notes (Signed)
Subjective:    Patient ID: Sophia Schmidt, female    DOB: 1950/10/19, 71 y.o.   MRN: [id number]  Sophia Schmidt is a 71 y.o. female presenting on 06/25/2021 for Foot Pain   HPI  Left Foot/Heel Pain Plantar Fasciitis Pes Cavus  Reports onset Left Foot / Heel pain onset end of November 2022, she says usually wears Ugg[employer] boots in winter to work and never had problem, she changed shoes to tennis shoes and still did not help.  Describes Left heel foot pain worse in AM when first moving and walking, improves at times during day if on feet, worse if seated for while and then get up to move again. Has pain with weight bearing each step when bothering her.  No injury or fall or twist. No swelling or redness or erythema. No numbness or tingling R foot does not bother her.  Has not seen Podiatry or other treatment. Tried Ibuprofen 610m x 1 dose only limited relief.   [facility] 12/04/2020 10/23/2020 01/07/2020  Decreased Interest 0 3 0  Down, Depressed, Hopeless 2 3 0  PHQ - 2 Score 2 6 0  Altered sleeping 2 0 -  Tired, decreased energy 2 3 -  Change in appetite 3 - -  Feeling bad or failure about yourself  3 0 -  Trouble concentrating 0 0 -  Moving slowly or fidgety/restless 3 0 -  Suicidal thoughts 1 0 -  PHQ-9 Score 16 9 -  Difficult doing work/chores Extremely dIfficult Not difficult at all -    Social History   Tobacco Use   Smoking status: Never   Smokeless tobacco: Never  Vaping Use   Vaping Use: Never used  Substance Use Topics   Alcohol use: No    Alcohol/week: 0.0 standard drinks   Drug use: No    Review of Systems Per HPI unless specifically indicated above     Objective:    BP 129/83    Pulse 77    Ht '5\' 5"'  (1.651 m)    Wt 156 lb 9.6 oz (71 kg)    SpO2 100%    BMI 26.06 kg/m   Wt Readings from Last 3 Encounters:  06/25/21 156 lb 9.6 oz (71 kg)  05/03/21 159 lb 3.2 oz (72.2 kg)  03/14/21 161 lb 3.2 oz (73.1 kg)    Physical Exam Vitals  and nursing note reviewed.  Constitutional:      General: She is not in acute distress.    Appearance: Normal appearance. She is well-developed. She is not diaphoretic.     Comments: Well-appearing, comfortable, cooperative  HENT:     Head: Normocephalic and atraumatic.  Eyes:     General:        Right eye: No discharge.        Left eye: No discharge.     Conjunctiva/sclera: Conjunctivae normal.  Cardiovascular:     Rate and Rhythm: Normal rate.  Pulmonary:     Effort: Pulmonary effort is normal.  Musculoskeletal:     Comments: Bilateral feet pes cavus with some mis-alignment of mid foot and ankle. No edema, no ecchymosis or erythema. Localized pain medial left plantar heel on exam provoked.  Skin:    General: Skin is warm and dry.     Findings: No erythema or rash.  Neurological:     Mental Status: She is alert and oriented to person, place, and time.  Psychiatric:  Mood and Affect: Mood normal.        Behavior: Behavior normal.        Thought Content: Thought content normal.     Comments: Well groomed, good eye contact, normal speech and thoughts     Results for orders placed or performed in visit on 03/08/21  TSH  Result Value Ref Range   TSH 2.32 0.40 - 4.50 mIU/L  VITAMIN D 25 Hydroxy (Vit-D Deficiency, Fractures)  Result Value Ref Range   Vit D, 25-Hydroxy 34 30 - 100 ng/mL  Lipid panel  Result Value Ref Range   Cholesterol 201 (H) <200 mg/dL   HDL 58 > OR = 50 mg/dL   Triglycerides 79 <150 mg/dL   LDL Cholesterol (Calc) 125 (H) mg/dL (calc)   Total CHOL/HDL Ratio 3.5 <5.0 (calc)   Non-HDL Cholesterol (Calc) 143 (H) <130 mg/dL (calc)  COMPLETE METABOLIC PANEL WITH GFR  Result Value Ref Range   Glucose, Bld 97 65 - 99 mg/dL   BUN 9 7 - 25 mg/dL   Creat 0.62 0.50 - 1.05 mg/dL   eGFR 96 > OR = 60 mL/min/1.52m   BUN/Creatinine Ratio NOT APPLICABLE 6 - 22 (calc)   Sodium 142 135 - 146 mmol/L   Potassium 4.0 3.5 - 5.3 mmol/L   Chloride 109 98 - 110 mmol/L    CO2 24 20 - 32 mmol/L   Calcium 10.7 (H) 8.6 - 10.4 mg/dL   Total Protein 6.7 6.1 - 8.1 g/dL   Albumin 4.3 3.6 - 5.1 g/dL   Globulin 2.4 1.9 - 3.7 g/dL (calc)   AG Ratio 1.8 1.0 - 2.5 (calc)   Total Bilirubin 0.3 0.2 - 1.2 mg/dL   Alkaline phosphatase (APISO) 92 37 - 153 U/L   AST 14 10 - 35 U/L   ALT 12 6 - 29 U/L  CBC with Differential/Platelet  Result Value Ref Range   WBC 5.0 3.8 - 10.8 Thousand/uL   RBC 4.38 3.80 - 5.10 Million/uL   Hemoglobin 11.5 (L) 11.7 - 15.5 g/dL   HCT 35.7 35.0 - 45.0 %   MCV 81.5 80.0 - 100.0 fL   MCH 26.3 (L) 27.0 - 33.0 pg   MCHC 32.2 32.0 - 36.0 g/dL   RDW 13.9 11.0 - 15.0 %   Platelets 287 140 - 400 Thousand/uL   MPV 10.9 7.5 - 12.5 fL   Neutro Abs 2,915 1,500 - 7,800 cells/uL   Lymphs Abs 1,685 850 - 3,900 cells/uL   Absolute Monocytes 250 200 - 950 cells/uL   Eosinophils Absolute 90 15 - 500 cells/uL   Basophils Absolute 60 0 - 200 cells/uL   Neutrophils Relative % 58.3 %   Total Lymphocyte 33.7 %   Monocytes Relative 5.0 %   Eosinophils Relative 1.8 %   Basophils Relative 1.2 %  Hemoglobin A1c  Result Value Ref Range   Hgb A1c MFr Bld 5.4 <5.7 % of total Hgb   Mean Plasma Glucose 108 mg/dL   eAG (mmol/L) 6.0 mmol/L      Assessment & Plan:   Problem List Items Addressed This Visit   None Visit Diagnoses     Plantar fasciitis of left foot    -  Primary   Relevant Medications   naproxen (NAPROSYN) 500 MG tablet   Need for pneumococcal vaccination       Relevant Orders   Pneumococcal conjugate vaccine 20-valent       Clinically consistent with acute vs subacute L plantar fasciitis based  on history and exam, localized pain. 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 Unlikely fracture based on history, no bony tenderness. - Limited conservative therapy   Plan: 1. Reviewed diagnosis and management of plantar fasciitis 2. Start rx NSAID trial - Naproxen 535m BID wc x 1-4 weeks then PRN 3. May take Tylenol PRN  breakthrough 4. May use topical muscle rub, ice 5. Emphasized importance of relative rest, ice, and avoid prolonged standing and overuse 6. Handout given and explained appropriate stretching and home exercises important to do first thing in morning and later - Tension Night Splint as well OTC for night-time stretching 7. Follow-up 4-6 weeks if not improved, consider referral to Podiatry for further eval x-ray and injection if indicated.   Meds ordered this encounter  Medications   naproxen (NAPROSYN) 500 MG tablet    Sig: Take 1 tablet (500 mg total) by mouth 2 (two) times daily with a meal. For 2-4 weeks then as needed    Dispense:  60 tablet    Refill:  1      Follow up plan: Return in about 6 weeks (around 08/06/2021) for 6 weeks follow-up if not improved plantar fasciitis - if not improved refer to Podiatry.   ANobie Putnam [facility] 06/25/2021, 3:05 PM

## 2021-07-20 ENCOUNTER — Ambulatory Visit (INDEPENDENT_AMBULATORY_CARE_PROVIDER_SITE_OTHER): Payer: 59

## 2021-07-20 ENCOUNTER — Other Ambulatory Visit: Payer: Self-pay

## 2021-07-20 VITALS — Wt 156.0 lb

## 2021-07-20 DIAGNOSIS — Z23 Encounter for immunization: Secondary | ICD-10-CM

## 2021-07-31 ENCOUNTER — Other Ambulatory Visit: Payer: Self-pay

## 2021-07-31 DIAGNOSIS — Z1211 Encounter for screening for malignant neoplasm of colon: Secondary | ICD-10-CM

## 2021-07-31 MED ORDER — SUTAB 1479-225-188 MG PO TABS: 12.0000 | ORAL_TABLET | Freq: Once | ORAL | 0 refills | Status: AC

## 2021-07-31 MED ORDER — NA SULFATE-K SULFATE-MG SULF 17.5-3.13-1.6 GM/177ML PO SOLN: 1.0000 | Freq: Once | ORAL | 0 refills | Status: AC

## 2021-07-31 NOTE — Progress Notes (Signed)
Gastroenterology Pre-Procedure Review  Request Date: 11/23/2021 Requesting Physician: Dr. Tobi Bastos  PATIENT REVIEW QUESTIONS: The patient responded to the following health history questions as indicated:    1. Are you having any GI issues? no 2. Do you have a personal history of Polyps? no 3. Do you have a family history of Colon Cancer or Polyps? no 4. Diabetes Mellitus? no 5. Joint replacements in the past 12 months?no 6. Major health problems in the past 3 months?no 7. Any artificial heart valves, MVP, or defibrillator?no    MEDICATIONS & ALLERGIES:    Patient reports the following regarding taking any anticoagulation/antiplatelet therapy:   Plavix, Coumadin, Eliquis, Xarelto, Lovenox, Pradaxa, Brilinta, or Effient? no Aspirin? no  Patient confirms/reports the following medications:  Current Outpatient Medications  Medication Sig Dispense Refill   albuterol (VENTOLIN HFA) 108 (90 Base) MCG/ACT inhaler Inhale 2 puffs into the lungs every 4 (four) hours as needed for wheezing or shortness of breath (cough). 1 each 5   aspirin EC 81 MG tablet Take 81 mg by mouth daily.     ferrous sulfate 325 (65 FE) MG tablet Take 1 tablet by mouth daily.     ipratropium (ATROVENT) 0.06 % nasal spray Place 2 sprays into both nostrils 4 (four) times daily. For up to 5-7 days then stop. 15 mL 0   montelukast (SINGULAIR) 10 MG tablet Take 1 tablet (10 mg total) by mouth at bedtime. 90 tablet 3   naproxen (NAPROSYN) 500 MG tablet Take 1 tablet (500 mg total) by mouth 2 (two) times daily with a meal. For 2-4 weeks then as needed 60 tablet 1   pantoprazole (PROTONIX) 40 MG tablet Take 40 mg by mouth every other day. 30 tablet 2   No current facility-administered medications for this visit.    Patient confirms/reports the following allergies:  Allergies  Allergen Reactions   Penicillins Rash    Pt states " breaks her out"    No orders of the defined types were placed in this  encounter.   AUTHORIZATION INFORMATION Primary Insurance: 1D#: Group #:  Secondary Insurance: 1D#: Group #:  SCHEDULE INFORMATION: Date: 11/23/2021 Time: Location:[facility]

## 2021-08-01 ENCOUNTER — Emergency Department: Payer: No Typology Code available for payment source

## 2021-08-01 ENCOUNTER — Other Ambulatory Visit: Payer: Self-pay

## 2021-08-01 ENCOUNTER — Emergency Department
Admission: EM | Admit: 2021-08-01 | Discharge: 2021-08-01 | Disposition: A | Discharge status: Home / Self Care | Payer: No Typology Code available for payment source | Attending: Emergency Medicine | Admitting: Emergency Medicine

## 2021-08-01 DIAGNOSIS — W01198A Fall on same level from slipping, tripping and stumbling with subsequent striking against other object, initial encounter: Secondary | ICD-10-CM | POA: Insufficient documentation

## 2021-08-01 DIAGNOSIS — Y990 Civilian activity done for income or pay: Secondary | ICD-10-CM | POA: Diagnosis not present

## 2021-08-01 DIAGNOSIS — R519 Headache, unspecified: Secondary | ICD-10-CM | POA: Insufficient documentation

## 2021-08-01 DIAGNOSIS — E876 Hypokalemia: Secondary | ICD-10-CM | POA: Insufficient documentation

## 2021-08-01 DIAGNOSIS — S60221A Contusion of right hand, initial encounter: Secondary | ICD-10-CM | POA: Insufficient documentation

## 2021-08-01 DIAGNOSIS — W19XXXD Unspecified fall, subsequent encounter: Secondary | ICD-10-CM

## 2021-08-01 DIAGNOSIS — S7002XA Contusion of left hip, initial encounter: Secondary | ICD-10-CM | POA: Insufficient documentation

## 2021-08-01 DIAGNOSIS — S6991XA Unspecified injury of right wrist, hand and finger(s), initial encounter: Secondary | ICD-10-CM | POA: Diagnosis present

## 2021-08-01 DIAGNOSIS — I10 Essential (primary) hypertension: Secondary | ICD-10-CM | POA: Insufficient documentation

## 2021-08-01 LAB — CBC
HCT: 33.7 % — ABNORMAL LOW (ref 36.0–46.0)
Hemoglobin: 10.9 g/dL — ABNORMAL LOW (ref 12.0–15.0)
MCH: 26 pg (ref 26.0–34.0)
MCHC: 32.3 g/dL (ref 30.0–36.0)
MCV: 80.4 fL (ref 80.0–100.0)
Platelets: 360 K/uL (ref 150–400)
RBC: 4.19 MIL/uL (ref 3.87–5.11)
RDW: 14.6 % (ref 11.5–15.5)
WBC: 7.8 K/uL (ref 4.0–10.5)
nRBC: 0 % (ref 0.0–0.2)

## 2021-08-01 LAB — BASIC METABOLIC PANEL WITH GFR
Anion gap: 7 (ref 5–15)
BUN: 9 mg/dL (ref 8–23)
CO2: 23 mmol/L (ref 22–32)
Calcium: 10.5 mg/dL — ABNORMAL HIGH (ref 8.9–10.3)
Chloride: 111 mmol/L (ref 98–111)
Creatinine, Ser: 0.52 mg/dL (ref 0.44–1.00)
GFR, Estimated: >60 mL/min
Glucose, Bld: 99 mg/dL (ref 70–99)
Potassium: 3.4 mmol/L — ABNORMAL LOW (ref 3.5–5.1)
Sodium: 141 mmol/L (ref 135–145)

## 2021-08-01 MED ORDER — POTASSIUM CHLORIDE CRYS ER 20 MEQ PO TBCR
40.0000 meq | EXTENDED_RELEASE_TABLET | Freq: Once | ORAL | Status: DC
  Filled 2021-08-01: qty 2

## 2021-08-01 MED ORDER — ACETAMINOPHEN 500 MG PO TABS
1000.0000 mg | ORAL_TABLET | Freq: Once | ORAL | Status: AC
  Administered 2021-08-01: 1000 mg via ORAL
  Filled 2021-08-01: qty 2

## 2021-08-01 NOTE — ED Triage Notes (Signed)
Pt to ED via POV from work. Pt sating she fell last Tuesday at work. Pt has been trying to manage symptoms at home with ice and ibuprofen. Pt reports worsening HA. Pt denies blurry vision but does report if she bends down and comes back up she has some blurry vision. Pt denies blood thinners. Pt seen at [facility] today and sent over for evaluation. Pt BP at [facility] reading [id number] sytolic.

## 2021-08-01 NOTE — ED Provider Notes (Signed)
[facility] Provider Note    Event Date/Time   First MD Initiated Contact with Patient 08/01/21 1631     (approximate)   History   Headache and Fall   HPI  Sophia Schmidt is a 71 y.o. female presents for further evaluation from clinic where she initially was seeking evaluation of persistent headache dizziness and some blurry vision after striking her head on 2/7.  It seems at this time she tripped over a rug and hit her desk.  She states that since then she has had mild intermittent headaches (within the last about 30 minutes and resolve on their own.  It is not clearly positional or exertional.  She states she will feel a little dizzy during these episodes.  She states that when she fell she also bruised her left hip although has been able to bear weight without difficulty and has no new pain in the left hip or in the lower extremities otherwise.  She also states she fell onto the right hand has a little soreness the base of the right thumb but no other pain or injuries from the fall in the upper extremities.  She denies any neck pain or back pain.  No other recent falls or injuries.  She denies being on any blood thinners.  No other recent sick symptoms including fevers, cough, nausea, vomiting, diarrhea, chest pain, Donnell pain, rash or extremity pain.  No other acute concerns at this time.      Physical Exam  Triage Vital Signs: ED Triage Vitals  Enc Vitals Group     BP 08/01/21 1621 (!) 172/92     Pulse Rate 08/01/21 1621 65     Resp 08/01/21 1621 18     Temp 08/01/21 1621 98 F (36.7 C)     Temp Source 08/01/21 1621 Oral     SpO2 08/01/21 1621 97 %     Weight 08/01/21 1618 160 lb (72.6 kg)     Height 08/01/21 1618 5\' 5"  (1.651 m)     Head Circumference --      Peak Flow --      Pain Score 08/01/21 1618 8     Pain Loc --      Pain Edu? --      Excl. in GC? --     Most recent vital signs: Vitals:   08/01/21 1621 08/01/21 1657  BP: (!)  172/92 (!) 176/99  Pulse: 65 68  Resp: 18 19  Temp: 98 F (36.7 C)   SpO2: 97% 98%    General: Awake, no distress.  CV:  Good peripheral perfusion.  2+ radial pulses Resp:  Normal effort.  Clear bilaterally. Abd:  No distention.  Soft throughout. Other:    No primary drift.  No finger dysmetria.  Some mild tenderness of the left hip and a little bit of tenderness over the volar aspect of the right hand just proximal to the metacarpal phalangeal joint of the first digit.  There is no snuffbox tenderness and sensation is otherwise intact in the distribution of the radial ulnar and median nerves in the right hand.  No other obvious trauma to the hand wrist or forearm.   CN II-XII grossly intact  No tenderness/deformities over the C/T/L spine  No focal TTP over b/l shoulders, elbows, wrists, hips, knees, ankles  2+ b/l radial and PD pulses   No other obvious trauma to face, scalp ,head, neck or torso    ED Results /  Procedures / Treatments  Labs (all labs ordered are listed, but only abnormal results are displayed) Labs Reviewed  CBC - Abnormal; Notable for the following components:      Result Value   Hemoglobin 10.9 (*)    HCT 33.7 (*)    All other components within normal limits  BASIC METABOLIC PANEL - Abnormal; Notable for the following components:   Potassium 3.4 (*)    Calcium 10.5 (*)    All other components within normal limits     EKG  RADIOLOGY Plain film of the right hand interpreted by myself shows no acute fracture or dislocation.  I also viewed radiology interpretation and agree with the findings.  CT head interpreted by myself show no evidence of intracranial hemorrhage, occult skull fracture, edema, mass effect or acute C-spine injury.  Also reviewed radiologist interpretation and agree with the findings of no acute process head CT head and C-spine. PROCEDURES:  Critical Care performed: No  Procedures   MEDICATIONS ORDERED IN ED: Medications   acetaminophen (TYLENOL) tablet 1,000 mg (has no administration in time range)  potassium chloride SA (KLOR-CON M) CR tablet 40 mEq (has no administration in time range)     IMPRESSION / MDM / ASSESSMENT AND PLAN / ED COURSE  I reviewed the triage vital signs and the nursing notes.                              Differential diagnosis includes, but is not limited to possible subarachnoid hemorrhage, occult skull fracture versus ongoing symptoms related to possible concussion.  From the car to left hip she has been bearing weight and has full strength and have a low sufficient for any significant orthopedic or other significant visible injury I suspect an uncomplicated contusion.  With regard to the right wrist and hand there is only a little bit of tenderness on the volar aspect just proximal to the thumb.  No other evidence of trauma in the hand and wrist are otherwise neurovascular intact.  Plain film obtained shows no acute fracture dislocation.  BMP obtained shows a K of 3.4 without any other significant lecture metabolic derangements.  CBC shows stable anemia hemoglobin of 10.9 compared to 11.54 months ago without evidence of leukocytosis and normal platelets.   Given patient's age I did obtain head and C-spine to rule out occult intracranial or C-spine injury.  CT head interpreted by myself show no evidence of intracranial hemorrhage, occult skull fracture, edema, mass effect or acute C-spine injury.  Also reviewed radiologist interpretation and agree with the findings of no acute process head CT head and C-spine.  Patient is notably hypertensive today she has no history of this.  Certainly could be related to her pain in her head.  She states she currently feels fine and that her headaches only intermittent.  Discussed importance of having this rechecked by her PCP as she may require antihypertensive medications.  She is declining any additional analgesia or work note to take time off from work  although I did emphasize importance of adequate rest and avoiding repeat head trauma given concern for likely concussion causing her symptoms.  She will follow-up with neurology for persistent headaches.  She will return for any new or worsening symptoms.  Discharged in stable condition.  Strict return precautions advised and discussed.      FINAL CLINICAL IMPRESSION(S) / ED DIAGNOSES   Final diagnoses:  Fall, subsequent encounter  Nonintractable  headache, unspecified chronicity pattern, unspecified headache type  Contusion of right hand, initial encounter  Contusion of left hip, initial encounter  Hypokalemia  Hypertension, unspecified type     Rx / DC Orders   ED Discharge Orders     None        Note:  This document was prepared using Dragon voice recognition software and may include unintentional dictation errors.   Gilles Chiquito, MD 08/01/21 [phone]

## 2021-09-21 ENCOUNTER — Ambulatory Visit (INDEPENDENT_AMBULATORY_CARE_PROVIDER_SITE_OTHER): Payer: 59

## 2021-09-21 ENCOUNTER — Ambulatory Visit: Payer: 59

## 2021-09-21 VITALS — Ht 65.0 in | Wt 160.0 lb

## 2021-09-21 DIAGNOSIS — Z23 Encounter for immunization: Secondary | ICD-10-CM | POA: Diagnosis not present

## 2021-09-28 ENCOUNTER — Ambulatory Visit: Payer: Self-pay

## 2021-09-28 NOTE — Telephone Encounter (Signed)
Pt called. LVMTCB, Unable to reach patient after 3 attempts by [facility] NT, routing to the provider for resolution per protocol.  ?

## 2021-09-28 NOTE — Telephone Encounter (Signed)
2nd attempt, pt called, LVMTCB 

## 2021-09-28 NOTE — Telephone Encounter (Signed)
Patient called, left VM to return the call to the office to discuss symptoms with a nurse.  ? ?Summary: Pt requests call back regarding pain in foot  ? Pt stated she needs the doctor to return her call to discuss pain in her foot. Pt requests return call asap. Cb# [phone]   ?  ? ?

## 2021-11-14 ENCOUNTER — Telehealth: Payer: Self-pay

## 2021-11-14 ENCOUNTER — Telehealth: Payer: Self-pay | Admitting: Gastroenterology

## 2021-11-14 NOTE — Telephone Encounter (Signed)
Patients call has been returned.  Offered condolences to her and her family as her sister has passed away.  Informed her she can call back to reschedule her colonoscopy when she is able to do so.  Thanks, [place], [place]

## 2021-11-14 NOTE — Telephone Encounter (Signed)
Patient called stating that she needs to reschedule her colonoscopy because her sister has passed away.

## 2021-11-23 ENCOUNTER — Ambulatory Visit
Admission: RE | Admit: 2021-11-23 | Payer: No Typology Code available for payment source | Source: Ambulatory Visit | Admitting: Gastroenterology

## 2021-11-23 ENCOUNTER — Encounter: Admission: RE | Payer: Self-pay | Source: Ambulatory Visit

## 2021-11-23 SURGERY — COLONOSCOPY WITH PROPOFOL
Anesthesia: General

## 2021-12-03 ENCOUNTER — Other Ambulatory Visit: Payer: Self-pay

## 2021-12-03 DIAGNOSIS — Z1211 Encounter for screening for malignant neoplasm of colon: Secondary | ICD-10-CM

## 2021-12-03 NOTE — Progress Notes (Signed)
Rescheduled colonoscopy sent new paperwork and sent new referral

## 2021-12-04 ENCOUNTER — Telehealth: Payer: Self-pay

## 2021-12-04 MED ORDER — SUTAB 1479-225-188 MG PO TABS: 12.0000 | ORAL_TABLET | Freq: Once | ORAL | 0 refills | Status: AC

## 2021-12-04 NOTE — Telephone Encounter (Signed)
Patient requested prep pills be sent in for her so I sent in sutabs and put coupons up front

## 2021-12-04 NOTE — Addendum Note (Signed)
Addended by: Linward Foster on: 12/04/2021 08:21 AM   Modules accepted: Orders

## 2021-12-09 ENCOUNTER — Emergency Department
Admission: EM | Admit: 2021-12-09 | Discharge: 2021-12-09 | Disposition: A | Discharge status: Home / Self Care | Payer: No Typology Code available for payment source | Attending: Emergency Medicine | Admitting: Emergency Medicine

## 2021-12-09 ENCOUNTER — Emergency Department: Payer: No Typology Code available for payment source

## 2021-12-09 DIAGNOSIS — R209 Unspecified disturbances of skin sensation: Secondary | ICD-10-CM | POA: Diagnosis not present

## 2021-12-09 DIAGNOSIS — Y92410 Unspecified street and highway as the place of occurrence of the external cause: Secondary | ICD-10-CM | POA: Insufficient documentation

## 2021-12-09 DIAGNOSIS — M545 Low back pain, unspecified: Secondary | ICD-10-CM | POA: Insufficient documentation

## 2021-12-09 DIAGNOSIS — M542 Cervicalgia: Secondary | ICD-10-CM | POA: Insufficient documentation

## 2021-12-09 MED ORDER — OXYCODONE-ACETAMINOPHEN 5-325 MG PO TABS
1.0000 | ORAL_TABLET | Freq: Once | ORAL | Status: AC
  Administered 2021-12-09: 1 via ORAL
  Filled 2021-12-09: qty 1

## 2021-12-09 MED ORDER — IBUPROFEN 400 MG PO TABS
400.0000 mg | ORAL_TABLET | Freq: Once | ORAL | Status: AC
  Administered 2021-12-09: 400 mg via ORAL
  Filled 2021-12-09: qty 1

## 2021-12-09 NOTE — ED Notes (Signed)
Pt A&O, pt given discharge instructions, pt able to ambulate but assisted out in wheelchair by family.
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
## 2021-12-11 ENCOUNTER — Ambulatory Visit: Payer: 59 | Admitting: Family Medicine

## 2021-12-11 ENCOUNTER — Encounter: Payer: Self-pay | Admitting: Family Medicine

## 2021-12-11 VITALS — BP 139/80 | HR 62 | Ht 64.0 in | Wt 150.0 lb

## 2021-12-11 DIAGNOSIS — M5136 Other intervertebral disc degeneration, lumbar region: Secondary | ICD-10-CM

## 2021-12-11 DIAGNOSIS — M47816 Spondylosis without myelopathy or radiculopathy, lumbar region: Secondary | ICD-10-CM | POA: Diagnosis not present

## 2021-12-11 DIAGNOSIS — M5442 Lumbago with sciatica, left side: Secondary | ICD-10-CM

## 2021-12-11 MED ORDER — CYCLOBENZAPRINE HCL 10 MG PO TABS: 10.0000 mg | ORAL_TABLET | Freq: Three times a day (TID) | ORAL | 1 refills | Status: DC | PRN

## 2021-12-11 MED ORDER — TRAMADOL HCL 50 MG PO TABS: 50.0000 mg | ORAL_TABLET | Freq: Four times a day (QID) | ORAL | 0 refills | Status: AC | PRN

## 2021-12-11 MED ORDER — NAPROXEN 500 MG PO TABS: 500.0000 mg | ORAL_TABLET | Freq: Two times a day (BID) | ORAL | 1 refills | Status: DC

## 2021-12-26 ENCOUNTER — Encounter
Admission: RE | Disposition: A | Discharge status: Home / Self Care | Payer: Self-pay | Source: Home / Self Care | Attending: Gastroenterology

## 2021-12-26 ENCOUNTER — Ambulatory Visit
Admission: RE | Admit: 2021-12-26 | Discharge: 2021-12-26 | Disposition: A | Discharge status: Home / Self Care | Payer: No Typology Code available for payment source | Attending: Gastroenterology | Admitting: Gastroenterology

## 2021-12-26 ENCOUNTER — Ambulatory Visit: Payer: No Typology Code available for payment source | Admitting: Anesthesiology

## 2021-12-26 ENCOUNTER — Encounter: Payer: Self-pay | Admitting: Gastroenterology

## 2021-12-26 DIAGNOSIS — Z1211 Encounter for screening for malignant neoplasm of colon: Secondary | ICD-10-CM | POA: Insufficient documentation

## 2021-12-26 DIAGNOSIS — K219 Gastro-esophageal reflux disease without esophagitis: Secondary | ICD-10-CM | POA: Insufficient documentation

## 2021-12-26 DIAGNOSIS — D649 Anemia, unspecified: Secondary | ICD-10-CM | POA: Diagnosis not present

## 2021-12-26 DIAGNOSIS — K5730 Diverticulosis of large intestine without perforation or abscess without bleeding: Secondary | ICD-10-CM | POA: Insufficient documentation

## 2021-12-26 HISTORY — PX: COLONOSCOPY WITH PROPOFOL: SHX5780

## 2021-12-26 SURGERY — COLONOSCOPY WITH PROPOFOL
Anesthesia: General

## 2021-12-26 MED ORDER — PROPOFOL 500 MG/50ML IV EMUL
INTRAVENOUS | Status: DC | PRN
  Administered 2021-12-26: 140 ug/kg/min via INTRAVENOUS

## 2021-12-26 MED ORDER — LIDOCAINE HCL (CARDIAC) PF 100 MG/5ML IV SOSY
PREFILLED_SYRINGE | INTRAVENOUS | Status: DC | PRN
  Administered 2021-12-26: 50 mg via INTRAVENOUS

## 2021-12-26 MED ORDER — SODIUM CHLORIDE 0.9 % IV SOLN
INTRAVENOUS | Status: DC
  Administered 2021-12-26: 1000 mL via INTRAVENOUS

## 2021-12-26 MED ORDER — PROPOFOL 10 MG/ML IV BOLUS
INTRAVENOUS | Status: DC | PRN
  Administered 2021-12-26: 50 mg via INTRAVENOUS

## 2021-12-26 NOTE — Anesthesia Preprocedure Evaluation (Signed)
Anesthesia Evaluation  Patient identified by MRN, date of birth, ID band Patient awake    Reviewed: Allergy & Precautions, NPO status , Patient's Chart, lab work & pertinent test results, reviewed documented beta blocker date and time   History of Anesthesia Complications Negative for: history of anesthetic complications  Airway Mallampati: II  TM Distance: >3 FB Neck ROM: Full    Dental no notable dental hx. (+) Upper Dentures, Lower Dentures   Pulmonary neg pulmonary ROS, neg sleep apnea, neg COPD, Patient abstained from smoking.Not current smoker,    Pulmonary exam normal breath sounds clear to auscultation       Cardiovascular Exercise Tolerance: Good METS(-) hypertension(-) CAD and (-) Past MI negative cardio ROS  (-) dysrhythmias  Rhythm:Regular Rate:Normal - Systolic murmurs    Neuro/Psych  Neuromuscular disease negative psych ROS   GI/Hepatic GERD  Controlled,(+)     (-) substance abuse  ,   Endo/Other  neg diabetes  Renal/GU negative Renal ROS     Musculoskeletal   Abdominal   Peds  Hematology  (+) Blood dyscrasia, anemia ,   Anesthesia Other Findings Past Medical History: No date: Anemia No date: GERD (gastroesophageal reflux disease)  Reproductive/Obstetrics                             Anesthesia Physical  Anesthesia Plan  ASA: 2  Anesthesia Plan: General   Post-op Pain Management: Minimal or no pain anticipated   Induction: Intravenous  PONV Risk Score and Plan: 3 and Propofol infusion, TIVA and Ondansetron  Airway Management Planned: Nasal Cannula  Additional Equipment: None  Intra-op Plan:   Post-operative Plan:   Informed Consent: I have reviewed the patients History and Physical, chart, labs and discussed the procedure including the risks, benefits and alternatives for the proposed anesthesia with the patient or authorized representative who has  indicated his/her understanding and acceptance.     Dental advisory given  Plan Discussed with: CRNA  Anesthesia Plan Comments: (Discussed risks of anesthesia with patient, including possibility of difficulty with spontaneous ventilation under anesthesia necessitating airway intervention, PONV, and rare risks such as cardiac or respiratory or neurological events, and allergic reactions. Discussed the role of CRNA in patient's perioperative care. Patient understands.)        Anesthesia Quick Evaluation

## 2021-12-26 NOTE — H&P (Signed)
Sophia Mood, MD [address], [place], [place], [address] [address], [place], [place], [address] Phone: [phone]  Fax: [phone]  Primary Care Physician:  Smitty Cords, DO   Pre-Procedure History & Physical: HPI:  Sophia Schmidt is a 71 y.o. female is here for an colonoscopy.   Past Medical History:  Diagnosis Date   Anemia    GERD (gastroesophageal reflux disease)     Past Surgical History:  Procedure Laterality Date   ABDOMINAL HYSTERECTOMY  1998   partial   BREAST CYST ASPIRATION Left 10/17/2014   FNA done by Dr. Evette Cristal   BREAST EXCISIONAL BIOPSY Left 1998   x 2   BREAST SURGERY Left 1998   removal of 2 fibrocystic masses   COLONOSCOPY  2011   COLONOSCOPY WITH PROPOFOL N/A 12/13/2016   Procedure: COLONOSCOPY WITH PROPOFOL;  Surgeon: Sophia Mood, MD;  Location: [facility] ENDOSCOPY;  Service: Endoscopy;  Laterality: N/A;   ESOPHAGOGASTRODUODENOSCOPY (EGD) WITH PROPOFOL N/A 12/13/2016   Procedure: ESOPHAGOGASTRODUODENOSCOPY (EGD) WITH PROPOFOL;  Surgeon: Sophia Mood, MD;  Location: [facility] ENDOSCOPY;  Service: Endoscopy;  Laterality: N/A;   TONSILLECTOMY  1996    Prior to Admission medications   Medication Sig Start Date End Date Taking? Authorizing Provider  ipratropium (ATROVENT) 0.06 % nasal spray Place 2 sprays into both nostrils 4 (four) times daily. For up to 5-7 days then stop. 05/03/21  Yes Karamalegos, Alexander J, DO  montelukast (SINGULAIR) 10 MG tablet Take 1 tablet (10 mg total) by mouth at bedtime. 03/14/21  Yes Karamalegos, Netta Neat, DO  naproxen (NAPROSYN) 500 MG tablet Take 1 tablet (500 mg total) by mouth 2 (two) times daily with a meal. For 1-2 weeks then as needed 12/11/21  Yes Karamalegos, Alexander J, DO  pantoprazole (PROTONIX) 40 MG tablet Take 40 mg by mouth every other day. 01/07/20  Yes Karamalegos, Netta Neat, DO  albuterol (VENTOLIN HFA) 108 (90 Base) MCG/ACT inhaler Inhale 2 puffs into the lungs every 4  (four) hours as needed for wheezing or shortness of breath (cough). 03/14/21   Karamalegos, Netta Neat, DO  cyclobenzaprine (FLEXERIL) 10 MG tablet Take 1 tablet (10 mg total) by mouth 3 (three) times daily as needed for muscle spasms. Patient not taking: Reported on 12/26/2021 12/11/21   Smitty Cords, DO  ferrous sulfate 325 (65 FE) MG tablet Take 1 tablet by mouth daily. 08/22/20 08/22/21  [provider]    Allergies as of 12/03/2021 - Review Complete 09/21/2021  Allergen Reaction Noted   Penicillins Rash 10/20/2020    Family History  Problem Relation Age of Onset   Cancer Mother        pancreatic   Alzheimer's disease Father    Cancer Sister        lung   Cancer Brother        lung   Breast cancer Sister        56   Colon cancer Neg Hx     Social History   Socioeconomic History   Marital status: Divorced    Spouse name: Not on file   Number of children: Not on file   Years of education: Not on file   Highest education level: Not on file  Occupational History   Not on file  Tobacco Use   Smoking status: Never   Smokeless tobacco: Never  Vaping Use   Vaping Use: Never used  Substance and Sexual Activity   Alcohol use: No  Alcohol/week: 0.0 standard drinks of alcohol   Drug use: No   Sexual activity: Yes  Other Topics Concern   Not on file  Social History Narrative   Not on file   Social Determinants of Health   Financial Resource Strain: Not on file  Food Insecurity: Not on file  Transportation Needs: Not on file  Physical Activity: Not on file  Stress: Not on file  Social Connections: Not on file  Intimate Partner Violence: Not on file    Review of Systems: See HPI, otherwise negative ROS  Physical Exam: BP (!) 139/93   Pulse 72   Temp (!) 96.3 F (35.7 C) (Temporal)   Resp 16   Ht 5\' 6"  (1.676 m)   Wt 68.2 kg   SpO2 100%   BMI 24.26 kg/m  General:   Alert,  pleasant and cooperative in NAD Head:  Normocephalic and  atraumatic. Neck:  Supple; no masses or thyromegaly. Lungs:  Clear throughout to auscultation, normal respiratory effort.    Heart:  +S1, +S2, Regular rate and rhythm, No edema. Abdomen:  Soft, nontender and nondistended. Normal bowel sounds, without guarding, and without rebound.   Neurologic:  Alert and  oriented x4;  grossly normal neurologically.  Impression/Plan: Sophia Schmidt is here for an colonoscopy to be performed for Screening colonoscopy average risk   Risks, benefits, limitations, and alternatives regarding  colonoscopy have been reviewed with the patient.  Questions have been answered.  All parties agreeable.   [name], MD  12/26/2021, 8:17 AM

## 2021-12-26 NOTE — Op Note (Signed)
[facility] Gastroenterology Patient Name: Sophia Schmidt Procedure Date: 12/26/2021 8:24 AM MRN: [id number] Account #: [id number] Date of Birth: July 01, 1950 Admit Type: Outpatient Age: 71 Room: [facility] ENDO ROOM 3 Gender: Female Note Status: Finalized Instrument Name: Prentice Docker [id number] Procedure:             Colonoscopy Indications:           Screening for colorectal malignant neoplasm Providers:             Wyline Mood MD, MD Referring MD:          Smitty Cords (Referring MD) Medicines:             Monitored Anesthesia Care Complications:         No immediate complications. Procedure:             Pre-Anesthesia Assessment:                        - Prior to the procedure, a History and Physical was                         performed, and patient medications, allergies and                         sensitivities were reviewed. The patient's tolerance                         of previous anesthesia was reviewed.                        - The risks and benefits of the procedure and the                         sedation options and risks were discussed with the                         patient. All questions were answered and informed                         consent was obtained.                        - ASA Grade Assessment: II - A patient with mild                         systemic disease.                        After obtaining informed consent, the colonoscope was                         passed under direct vision. Throughout the procedure,                         the patient's blood pressure, pulse, and oxygen                         saturations were monitored continuously. The                         Colonoscope was introduced  through the anus and                         advanced to the the cecum, identified by the                         appendiceal orifice. The colonoscopy was performed                         with ease. The patient tolerated the procedure  well.                         The quality of the bowel preparation was excellent. Findings:      The perianal and digital rectal examinations were normal.      Multiple small-mouthed diverticula were found in the sigmoid colon.      The exam was otherwise without abnormality on direct and retroflexion       views. Impression:            - Diverticulosis in the sigmoid colon.                        - The examination was otherwise normal on direct and                         retroflexion views.                        - No specimens collected. Recommendation:        - Discharge patient to home (with escort).                        - Resume previous diet.                        - Continue present medications.                        - Repeat colonoscopy in 10 years for screening                         purposes. Procedure Code(s):     --- Professional ---                        [phone], Colonoscopy, flexible; diagnostic, including                         collection of specimen(s) by brushing or washing, when                         performed (separate procedure) Diagnosis Code(s):     --- Professional ---                        Z12.11, Encounter for screening for malignant neoplasm                         of colon                        K57.30, Diverticulosis of large intestine without  perforation or abscess without bleeding CPT copyright 2019 American Medical Association. All rights reserved. The codes documented in this report are preliminary and upon coder review may  be revised to meet current compliance requirements. Wyline Mood, MD Wyline Mood MD, MD 12/26/2021 8:51:24 AM This report has been signed electronically. Number of Addenda: 0 Note Initiated On: 12/26/2021 8:24 AM Scope Withdrawal Time: 0 hours 7 minutes 55 seconds  Total Procedure Duration: 0 hours 9 minutes 49 seconds  Estimated Blood Loss:  Estimated blood loss: none.      [facility]

## 2021-12-26 NOTE — Anesthesia Procedure Notes (Signed)
Date/Time: 12/26/2021 8:41 AM  Performed by: Joanette Gula, Rhydian Baldi, CRNAPre-anesthesia Checklist: Patient identified, Emergency Drugs available, Suction available, Patient being monitored and Timeout performed Patient Re-evaluated:Patient Re-evaluated prior to induction Oxygen Delivery Method: Nasal cannula Induction Type: IV induction

## 2021-12-26 NOTE — Transfer of Care (Signed)
Immediate Anesthesia Transfer of Care Note  Patient: Sophia Schmidt  Procedure(s) Performed: COLONOSCOPY WITH PROPOFOL  Patient Location: Endoscopy Unit  Anesthesia Type:General  Level of Consciousness: awake and drowsy  Airway & Oxygen Therapy: Patient Spontanous Breathing  Post-op Assessment: Report given to RN and Post -op Vital signs reviewed and stable  Post vital signs: Reviewed and stable  Last Vitals:  Vitals Value Taken Time  BP 140/89   Temp    Pulse 76 12/26/21 0852  Resp 17 12/26/21 0852  SpO2 100 % 12/26/21 0852    Last Pain:  Vitals:   12/26/21 0722  TempSrc: Temporal  PainSc: 0-No pain         Complications: No notable events documented.

## 2021-12-26 NOTE — Anesthesia Postprocedure Evaluation (Signed)
Anesthesia Post Note  Patient: Sophia Schmidt  Procedure(s) Performed: COLONOSCOPY WITH PROPOFOL  Patient location during evaluation: Endoscopy Anesthesia Type: General Level of consciousness: awake and alert Pain management: pain level controlled Vital Signs Assessment: post-procedure vital signs reviewed and stable Respiratory status: spontaneous breathing, nonlabored ventilation, respiratory function stable and patient connected to nasal cannula oxygen Cardiovascular status: blood pressure returned to baseline and stable Postop Assessment: no apparent nausea or vomiting Anesthetic complications: no   No notable events documented.   Last Vitals:  Vitals:   12/26/21 0902 12/26/21 0912  BP: (!) 150/94   Pulse: 60 (!) 55  Resp: (!) 23 20  Temp:    SpO2: 100% 100%    Last Pain:  Vitals:   12/26/21 0912  TempSrc:   PainSc: 0-No pain                 Corinda Gubler

## 2022-01-14 ENCOUNTER — Ambulatory Visit: Payer: Self-pay

## 2022-01-14 DIAGNOSIS — R059 Cough, unspecified: Secondary | ICD-10-CM

## 2022-01-14 NOTE — Telephone Encounter (Signed)
Summary: Coug + Sore throat   Pt called requesting to speak to her PCP about a cough/sore throat that she has had since yesterday. Wants a call back today   Best contact: [phone]      Chief Complaint: dry cough Symptoms: sore throat Frequency: this am Pertinent Negatives: Patient denies wheezing, chest pain, SOB, fever Disposition: [] ED /[] Urgent Care (no appt availability in office) / [x] Appointment(In office/virtual)/ []  [place] [facility]/ [] Home Care/ [] Refused Recommended Disposition /[]  Mobile Bus/ []  Follow-up with PCP Additional Notes: virtual appt tomorrow with PCP- Pt would like prescription cough syrup called in. Advised pt would need appt.   Reason for Disposition  [1] Patient also has allergy symptoms (e.g., itchy eyes, clear nasal discharge, postnasal drip) AND [2] they are acting up  Answer Assessment - Initial Assessment Questions 1. ONSET: "When did the cough begin?"      Early this am 2. SEVERITY: "How bad is the cough today?"      constant 3. SPUTUM: "Describe the color of your sputum" (none, dry cough; clear, white, yellow, green)     no 4. HEMOPTYSIS: "Are you coughing up any blood?" If so ask: "How much?" (flecks, streaks, tablespoons, etc.)     no 5. DIFFICULTY BREATHING: "Are you having difficulty breathing?" If Yes, ask: "How bad is it?" (e.g., mild, moderate, severe)    - MILD: No SOB at rest, mild SOB with walking, speaks normally in sentences, can lie down, no retractions, pulse < 100.    - MODERATE: SOB at rest, SOB with minimal exertion and prefers to sit, cannot lie down flat, speaks in phrases, mild retractions, audible wheezing, pulse 100-120.    - SEVERE: Very SOB at rest, speaks in single words, struggling to breathe, sitting hunched forward, retractions, pulse > 120      no 6. FEVER: "Do you have a fever?" If Yes, ask: "What is your temperature, how was it measured, and when did it start?"     no 7. CARDIAC HISTORY: "Do you  have any history of heart disease?" (e.g., heart attack, congestive heart failure)      no 8. LUNG HISTORY: "Do you have any history of lung disease?"  (e.g., pulmonary embolus, asthma, emphysema)     no 9. PE RISK FACTORS: "Do you have a history of blood clots?" (or: recent major surgery, recent prolonged travel, bedridden)     no 10. OTHER SYMPTOMS: "Do you have any other symptoms?" (e.g., runny nose, wheezing, chest pain)       Sore throat  12. TRAVEL: "Have you traveled out of the country in the last month?" (e.g., travel history, exposures)       no  Protocols used: Cough - Acute Non-Productive-A-AH

## 2022-01-15 ENCOUNTER — Telehealth (INDEPENDENT_AMBULATORY_CARE_PROVIDER_SITE_OTHER): Payer: 59 | Admitting: Family Medicine

## 2022-01-15 ENCOUNTER — Encounter: Payer: Self-pay | Admitting: Family Medicine

## 2022-01-15 VITALS — Ht 66.0 in | Wt 150.0 lb

## 2022-01-15 DIAGNOSIS — R059 Cough, unspecified: Secondary | ICD-10-CM | POA: Diagnosis not present

## 2022-01-15 DIAGNOSIS — J0110 Acute frontal sinusitis, unspecified: Secondary | ICD-10-CM | POA: Diagnosis not present

## 2022-01-15 DIAGNOSIS — J479 Bronchiectasis, uncomplicated: Secondary | ICD-10-CM

## 2022-01-15 MED ORDER — AZITHROMYCIN 250 MG PO TABS: ORAL_TABLET | ORAL | 0 refills | Status: DC

## 2022-01-15 MED ORDER — GUAIFENESIN-CODEINE 100-10 MG/5ML PO SYRP: 5.0000 mL | ORAL_SOLUTION | Freq: Four times a day (QID) | ORAL | 0 refills | Status: DC | PRN

## 2022-01-15 MED ORDER — IPRATROPIUM BROMIDE 0.06 % NA SOLN: 2.0000 | Freq: Four times a day (QID) | NASAL | 0 refills | Status: DC

## 2022-01-15 MED ORDER — BENZONATATE 100 MG PO CAPS: 100.0000 mg | ORAL_CAPSULE | Freq: Three times a day (TID) | ORAL | 0 refills | Status: DC | PRN

## 2022-01-15 MED ORDER — PREDNISONE 20 MG PO TABS: ORAL_TABLET | ORAL | 0 refills | Status: DC

## 2022-01-15 NOTE — Progress Notes (Addendum)
Subjective:    Patient ID: Sophia Schmidt, female    DOB: 1951/05/01, 71 y.o.   MRN: [id number]  SHERIDYN CANINO is a 71 y.o. female presenting on 01/15/2022 for Sore Throat and Cough   Patient presents for a same day appointment.  Virtual / Telehealth Encounter - Video Visit via MyChart The purpose of this virtual visit is to provide medical care while limiting exposure to the novel coronavirus (COVID19) for both patient and office staff.  Consent was obtained for remote visit:  Yes.   Answered questions that patient had about telehealth interaction:  Yes.   I discussed the limitations, risks, security and privacy concerns of performing an evaluation and management service by video/telephone. I also discussed with the patient that there may be a patient responsible charge related to this service. The patient expressed understanding and agreed to proceed.  Patient Location: Home Provider Location: [facility] (Office)  Participants in virtual visit: - Patient: Sophia Schmidt - CMA: Burnell Blanks, CMA - Provider: Dr Althea Charon   HPI  Viral Respiratory Infection / Rhinosinusitis Cough History of Bronchiectasis   She has had acute onset symptoms mostly respiratory with sinus congestion and cough for past 2 days, now has some chest discomfort due to coughing with rib strains. She has history of pneumonia multifocal bronchiectasis in past, required complex course >1 year ago, and she is worried about pneumonia now with infections. She does not feel it is severe but wanted to catch it early. She has not had fever chills or other worsening symptoms. No sick contacts      12/04/2020    3:19 PM 10/23/2020    2:22 PM 01/07/2020    3:42 PM  Depression screen PHQ 2/9  Decreased Interest 0 3 0  Down, Depressed, Hopeless 2 3 0  PHQ - 2 Score 2 6 0  Altered sleeping 2 0   Tired, decreased energy 2 3   Change in appetite 3    Feeling bad or failure about yourself   3 0   Trouble concentrating 0 0   Moving slowly or fidgety/restless 3 0   Suicidal thoughts 1 0   PHQ-9 Score 16 9   Difficult doing work/chores Extremely dIfficult Not difficult at all     Social History   Tobacco Use   Smoking status: Never   Smokeless tobacco: Never  Vaping Use   Vaping Use: Never used  Substance Use Topics   Alcohol use: No    Alcohol/week: 0.0 standard drinks of alcohol   Drug use: No    Review of Systems Per HPI unless specifically indicated above     Objective:    Ht 5\' 6"  (1.676 m)   Wt 150 lb (68 kg)   BMI 24.21 kg/m   Wt Readings from Last 3 Encounters:  01/15/22 150 lb (68 kg)  12/26/21 150 lb 5 oz (68.2 kg)  12/11/21 150 lb (68 kg)    Physical Exam  Note examination was completely remotely via video observation objective data only  Gen - well-appearing, no acute distress or apparent pain, comfortable HEENT - eyes appear clear without discharge or redness Heart/Lungs - cannot examine virtually - observed no evidence of labored breathing. Has cough with some bronchospasm. Abd - cannot examine virtually  Skin - face visible today- no rash Neuro - awake, alert, oriented Psych - not anxious appearing   Results for orders placed or performed during the hospital encounter of 08/01/21  CBC  Result Value Ref Range   WBC 7.8 4.0 - 10.5 K/uL   RBC 4.19 3.87 - 5.11 MIL/uL   Hemoglobin 10.9 (L) 12.0 - 15.0 g/dL   HCT 96.7 (L) 89.3 - 81.0 %   MCV 80.4 80.0 - 100.0 fL   MCH 26.0 26.0 - 34.0 pg   MCHC 32.3 30.0 - 36.0 g/dL   RDW 17.5 10.2 - 58.5 %   Platelets 360 150 - 400 K/uL   nRBC 0.0 0.0 - 0.2 %  Basic metabolic panel  Result Value Ref Range   Sodium 141 135 - 145 mmol/L   Potassium 3.4 (L) 3.5 - 5.1 mmol/L   Chloride 111 98 - 111 mmol/L   CO2 23 22 - 32 mmol/L   Glucose, Bld 99 70 - 99 mg/dL   BUN 9 8 - 23 mg/dL   Creatinine, Ser 2.77 0.44 - 1.00 mg/dL   Calcium 82.4 (H) 8.9 - 10.3 mg/dL   GFR, Estimated >23 >53 mL/min    Anion gap 7 5 - 15      Assessment & Plan:   Problem List Items Addressed This Visit   None Visit Diagnoses     Acute non-recurrent frontal sinusitis    -  Primary   Relevant Medications   ipratropium (ATROVENT) 0.06 % nasal spray   benzonatate (TESSALON) 100 MG capsule   azithromycin (ZITHROMAX Z-PAK) 250 MG tablet   predniSONE (DELTASONE) 20 MG tablet   Cough, unspecified type       Relevant Medications   benzonatate (TESSALON) 100 MG capsule   Bronchiectasis without complication (HCC)       Relevant Medications   azithromycin (ZITHROMAX Z-PAK) 250 MG tablet   predniSONE (DELTASONE) 20 MG tablet       Consistent with acute frontal rhinosinusitis, likely initially viral URI vs allergic rhinitis component without evidence of bacterial infection today. Possible viral infection now improving, afebrile. No sick contacts She has complex history of prior bronchiectasis multifocal pneumonia  Plan: 1. Reassurance, likely self-limited - no indication for antibiotics at this time. However given her high risk history, we discussed aggressive symptom management and if not improving will add back up plan treatment today she can fill or hold at pharmacy and start 3-5 days after onset of symptoms if not improved  Start Atrovent nasal spray decongestant 2 sprays in each nostril up to 4 times daily for 7 days Cough Syrup codeine already ordered Mucinex OTC Use Albuterol PRN  If not improved back up plan orders  Start Azithromycin Z pak (antibiotic) 2 tabs day 1, then 1 tab x 4 days, complete entire course even if improved  Predinsone taper 7 day  Return criteria reviewed. Consider Chest X-ray   Meds ordered this encounter  Medications   ipratropium (ATROVENT) 0.06 % nasal spray    Sig: Place 2 sprays into both nostrils 4 (four) times daily. For up to 5-7 days then stop.    Dispense:  15 mL    Refill:  0   benzonatate (TESSALON) 100 MG capsule    Sig: Take 1 capsule (100 mg total)  by mouth 3 (three) times daily as needed for cough.    Dispense:  30 capsule    Refill:  0   azithromycin (ZITHROMAX Z-PAK) 250 MG tablet    Sig: Take 2 tabs (500mg  total) on Day 1. Take 1 tab (250mg ) daily for next 4 days.    Dispense:  6 tablet    Refill:  0  predniSONE (DELTASONE) 20 MG tablet    Sig: Take daily with food. Start with 60mg  (3 pills) x 2 days, then reduce to 40mg  (2 pills) x 2 days, then 20mg  (1 pill) x 3 days    Dispense:  13 tablet    Refill:  0      Follow up plan: Return if symptoms worsen or fail to improve.  Patient verbalizes understanding with the above medical recommendations including the limitation of remote medical advice.  Specific follow-up and call-back criteria were given for patient to follow-up or seek medical care more urgently if needed.  Total duration of direct patient care provided via video conference: 10 minutes  , DO [facility] 01/15/2022, 4:12 PM

## 2022-01-15 NOTE — Addendum Note (Signed)
Addended by: Smitty Cords on: 01/15/2022 09:17 AM   Modules accepted: Orders

## 2022-01-15 NOTE — Telephone Encounter (Signed)
Please notify her that I have sent cough syrup to her pharmacy. She should keep her apt tomorrow please  Saralyn Pilar, DO [facility] 01/15/2022, 9:17 AM

## 2022-01-15 NOTE — Patient Instructions (Addendum)
Use cough medicine as prescribed Start Atrovent nasal spray decongestant 2 sprays in each nostril up to 4 times daily for 7 days If not improving 3-5 days after onset of symptoms, may try prednisone steroid taper, consider Azithromycin Zpak if needed Consider return call us for X-ray next week if need.  Please schedule a Follow-up Appointment to: Return if symptoms worsen or fail to improve.  If you have any other questions or concerns, please feel free to call the office or send a message through MyChart. You may also schedule an earlier appointment if necessary.  Additionally, you may be receiving a survey about your experience at our office within a few days to 1 week by e-mail or mail. We value your feedback.  Saralyn Pilar, DO [facility], [place]

## 2022-03-26 ENCOUNTER — Other Ambulatory Visit: Payer: Self-pay | Admitting: Family Medicine

## 2022-04-01 ENCOUNTER — Other Ambulatory Visit: Payer: Self-pay | Admitting: Family Medicine

## 2022-04-01 DIAGNOSIS — Z1231 Encounter for screening mammogram for malignant neoplasm of breast: Secondary | ICD-10-CM

## 2022-04-30 ENCOUNTER — Ambulatory Visit
Admission: RE | Admit: 2022-04-30 | Discharge: 2022-04-30 | Disposition: A | Discharge status: Home / Self Care | Payer: No Typology Code available for payment source | Source: Ambulatory Visit | Attending: Family Medicine | Admitting: Family Medicine

## 2022-04-30 DIAGNOSIS — Z1231 Encounter for screening mammogram for malignant neoplasm of breast: Secondary | ICD-10-CM | POA: Diagnosis present

## 2022-05-15 ENCOUNTER — Ambulatory Visit
Admission: RE | Admit: 2022-05-15 | Discharge: 2022-05-15 | Disposition: A | Discharge status: Home / Self Care | Payer: No Typology Code available for payment source | Attending: Family Medicine | Admitting: Family Medicine

## 2022-05-15 ENCOUNTER — Ambulatory Visit: Payer: 59 | Admitting: Family Medicine

## 2022-05-15 ENCOUNTER — Encounter: Payer: Self-pay | Admitting: Family Medicine

## 2022-05-15 ENCOUNTER — Ambulatory Visit
Admission: RE | Admit: 2022-05-15 | Discharge: 2022-05-15 | Disposition: A | Discharge status: Home / Self Care | Payer: No Typology Code available for payment source | Source: Ambulatory Visit | Attending: Family Medicine | Admitting: Family Medicine

## 2022-05-15 VITALS — BP 150/80 | HR 88 | Ht 66.0 in | Wt 160.0 lb

## 2022-05-15 DIAGNOSIS — J189 Pneumonia, unspecified organism: Secondary | ICD-10-CM

## 2022-05-15 DIAGNOSIS — J0110 Acute frontal sinusitis, unspecified: Secondary | ICD-10-CM | POA: Diagnosis not present

## 2022-05-15 DIAGNOSIS — J479 Bronchiectasis, uncomplicated: Secondary | ICD-10-CM

## 2022-05-15 DIAGNOSIS — R059 Cough, unspecified: Secondary | ICD-10-CM

## 2022-05-15 MED ORDER — ALBUTEROL SULFATE HFA 108 (90 BASE) MCG/ACT IN AERS: 2.0000 | INHALATION_SPRAY | RESPIRATORY_TRACT | 5 refills | Status: DC | PRN

## 2022-05-15 MED ORDER — AZITHROMYCIN 250 MG PO TABS: ORAL_TABLET | ORAL | 0 refills | Status: DC

## 2022-05-15 MED ORDER — PREDNISONE 20 MG PO TABS: ORAL_TABLET | ORAL | 0 refills | Status: DC

## 2022-05-15 MED ORDER — GUAIFENESIN-CODEINE 100-10 MG/5ML PO SYRP: 5.0000 mL | ORAL_SOLUTION | Freq: Four times a day (QID) | ORAL | 0 refills | Status: DC | PRN

## 2022-05-15 NOTE — Patient Instructions (Addendum)
Thank you for coming to the office today.  Chest X-ray today, stay tuned on results.  Start treatment for the coughing spells  It sounds like bronchospasm or tightness with breathing episodic, can be like an asthma reaction.  It does not sound exactly like an infection or pneumonia  Start the Prednisone steroid taper for breathing Restart the Albuterol rescue inhaler  Keep on the cough medicine perls and syrup as needed  If not improving in 48 hours add the Azithromycin Zpak  Please call your [facility] Pulmonology to follow up on your lungs. Last seen July 2022.  Charna Busman, MD  [address]  4th Floor Bioinformatics Bldg  [id number]  [place], [place] [address]  [phone] (Work)   Please schedule a Follow-up Appointment to: Return if symptoms worsen or fail to improve.  If you have any other questions or concerns, please feel free to call the office or send a message through MyChart. You may also schedule an earlier appointment if necessary.  Additionally, you may be receiving a survey about your experience at our office within a few days to 1 week by e-mail or mail. We value your feedback.  Saralyn Pilar, DO [facility], [place]

## 2022-05-15 NOTE — Progress Notes (Signed)
Subjective:    Patient ID: Sophia Schmidt, female    DOB: 10-14-1950, 71 y.o.   MRN: [id number]  Sophia Schmidt is a 71 y.o. female presenting on 05/15/2022 for Cough and Wheezing   HPI  Acute Sinusitis with cough/bronchospasm History Bronchiectasis  She had done well for the past 1 year. She admits recent worsening of cough and URI symptoms, reports onset 3 weeks ago with cough and hoarse voice and sinus symptoms drainage and coughing. Some days are better than others, she has persistent coughing spells and hoarse voice laryngitis, she will feel short winded.  Symptoms onset with sore throat 1 week ago then hoarse voice laryngitis and cough developed, persistent cough. Thicker phlegm productive No sick contacts. Grandson sick with URI Her oxygen level has been around 97% pulse ox at home History of bronchiectasis with complicated pneumonia in the past. Denies fever or chills, nausea vomiting   Health Maintenance: She is up to date on vaccines COVID RSV Pneumonia Flu     12/04/2020    3:19 PM 10/23/2020    2:22 PM 01/07/2020    3:42 PM  Depression screen PHQ 2/9  Decreased Interest 0 3 0  Down, Depressed, Hopeless 2 3 0  PHQ - 2 Score 2 6 0  Altered sleeping 2 0   Tired, decreased energy 2 3   Change in appetite 3    Feeling bad or failure about yourself  3 0   Trouble concentrating 0 0   Moving slowly or fidgety/restless 3 0   Suicidal thoughts 1 0   PHQ-9 Score 16 9   Difficult doing work/chores Extremely dIfficult Not difficult at all     Social History   Tobacco Use   Smoking status: Never   Smokeless tobacco: Never  Vaping Use   Vaping Use: Never used  Substance Use Topics   Alcohol use: No    Alcohol/week: 0.0 standard drinks of alcohol   Drug use: No    Review of Systems Per HPI unless specifically indicated above     Objective:    BP (!) 150/80   Pulse 88   Ht 5\' 6"  (1.676 m)   Wt 160 lb (72.6 kg)   SpO2 100%   BMI 25.82 kg/m   Wt  Readings from Last 3 Encounters:  05/15/22 160 lb (72.6 kg)  01/15/22 150 lb (68 kg)  12/26/21 150 lb 5 oz (68.2 kg)    Physical Exam Vitals and nursing note reviewed.  Constitutional:      General: She is not in acute distress.    Appearance: She is well-developed. She is not diaphoretic.     Comments: Well-appearing, comfortable, cooperative  HENT:     Head: Normocephalic and atraumatic.  Eyes:     General:        Right eye: No discharge.        Left eye: No discharge.     Conjunctiva/sclera: Conjunctivae normal.  Neck:     Thyroid: No thyromegaly.  Cardiovascular:     Rate and Rhythm: Normal rate and regular rhythm.     Heart sounds: Normal heart sounds. No murmur heard. Pulmonary:     Effort: No respiratory distress.     Breath sounds: No wheezing or rales.     Comments: Coughing spells upper airway bronchospasm tightness, speaks mostly full sentences has occasional cough spell. No obvious lower respiratory wheeze or rhonchi or crackles Musculoskeletal:        General: Normal range  of motion.     Cervical back: Normal range of motion and neck supple.  Lymphadenopathy:     Cervical: No cervical adenopathy.  Skin:    General: Skin is warm and dry.     Findings: No erythema or rash.  Neurological:     Mental Status: She is alert and oriented to person, place, and time.  Psychiatric:        Behavior: Behavior normal.     Comments: Well groomed, good eye contact, normal speech and thoughts    I have personally reviewed the radiology report from Chest X-ray 05/15/22.  CLINICAL DATA:  Recurrent cough   History of bronchiectasis   EXAM:  CHEST - 2 VIEW   COMPARISON:  02/02/2010   FINDINGS:  Cardiomediastinal silhouette and pulmonary vasculature are within  normal limits.   Tree in bud type opacities seen in the right upper lobe are  suspicious for pneumonitis. Lungs are otherwise clear.   IMPRESSION:  Tree in bud type opacities in the right upper lobe are  suspicious  for pneumonitis.    Electronically Signed    By: Acquanetta Belling M.D.    On: 05/15/2022 15:20  Results for orders placed or performed during the hospital encounter of 08/01/21  CBC  Result Value Ref Range   WBC 7.8 4.0 - 10.5 K/uL   RBC 4.19 3.87 - 5.11 MIL/uL   Hemoglobin 10.9 (L) 12.0 - 15.0 g/dL   HCT 33.0 (L) 07.6 - 22.6 %   MCV 80.4 80.0 - 100.0 fL   MCH 26.0 26.0 - 34.0 pg   MCHC 32.3 30.0 - 36.0 g/dL   RDW 33.3 54.5 - 62.5 %   Platelets 360 150 - 400 K/uL   nRBC 0.0 0.0 - 0.2 %  Basic metabolic panel  Result Value Ref Range   Sodium 141 135 - 145 mmol/L   Potassium 3.4 (L) 3.5 - 5.1 mmol/L   Chloride 111 98 - 111 mmol/L   CO2 23 22 - 32 mmol/L   Glucose, Bld 99 70 - 99 mg/dL   BUN 9 8 - 23 mg/dL   Creatinine, Ser 6.38 0.44 - 1.00 mg/dL   Calcium 93.7 (H) 8.9 - 10.3 mg/dL   GFR, Estimated >34 >28 mL/min   Anion gap 7 5 - 15      Assessment & Plan:   Problem List Items Addressed This Visit   None Visit Diagnoses     Bronchiectasis without complication (HCC)    -  Primary   Relevant Medications   azithromycin (ZITHROMAX Z-PAK) 250 MG tablet   albuterol (VENTOLIN HFA) 108 (90 Base) MCG/ACT inhaler   predniSONE (DELTASONE) 20 MG tablet   Other Relevant Orders   DG Chest 2 View   Cough, unspecified type       Relevant Medications   guaiFENesin-codeine (ROBITUSSIN AC) 100-10 MG/5ML syrup   Other Relevant Orders   DG Chest 2 View   Acute non-recurrent frontal sinusitis       Relevant Medications   guaiFENesin-codeine (ROBITUSSIN AC) 100-10 MG/5ML syrup   azithromycin (ZITHROMAX Z-PAK) 250 MG tablet   predniSONE (DELTASONE) 20 MG tablet   Multifocal pneumonia       Relevant Medications   guaiFENesin-codeine (ROBITUSSIN AC) 100-10 MG/5ML syrup   azithromycin (ZITHROMAX Z-PAK) 250 MG tablet   albuterol (VENTOLIN HFA) 108 (90 Base) MCG/ACT inhaler       Meds ordered this encounter  Medications   guaiFENesin-codeine (ROBITUSSIN AC) 100-10 MG/5ML  syrup  Sig: Take 5-10 mLs by mouth 4 (four) times daily as needed for cough.    Dispense:  200 mL    Refill:  0   azithromycin (ZITHROMAX Z-PAK) 250 MG tablet    Sig: Take 2 tabs (500mg  total) on Day 1. Take 1 tab (250mg ) daily for next 4 days.    Dispense:  6 tablet    Refill:  0   albuterol (VENTOLIN HFA) 108 (90 Base) MCG/ACT inhaler    Sig: Inhale 2 puffs into the lungs every 4 (four) hours as needed for wheezing or shortness of breath (cough).    Dispense:  1 each    Refill:  5   predniSONE (DELTASONE) 20 MG tablet    Sig: Take daily with food. Start with 60mg  (3 pills) x 2 days, then reduce to 40mg  (2 pills) x 2 days, then 20mg  (1 pill) x 3 days    Dispense:  13 tablet    Refill:  0   Chest X-ray today, stay tuned on results. - Results reviewed with RUL pneumonitis evidence, called patient back. Okay to start Zpak now. No pneumonia but likely inflammation or trigger for her cough can be related. Next is f/u with Pulm CT imaging.  Start treatment for the coughing spells  It sounds like bronchospasm or tightness with breathing episodic, can be like an asthma reaction.  It does not sound exactly like an infection or pneumonia  Start the Prednisone steroid taper for breathing Restart the Albuterol rescue inhaler  Keep on the cough medicine perls and syrup as needed  If not improving in 48 hours add the Azithromycin Zpak  Please call your [facility] Pulmonology to follow up on your lungs. Last seen July 2022.  , MD  [address]  4th Floor Bioinformatics Bldg  [id number]  [place], [facility] August 2022  [phone] (Work)    Follow up plan: Return if symptoms worsen or fail to improve.  [address], DO [facility] 05/15/2022, 2:53 PM

## 2022-10-31 ENCOUNTER — Ambulatory Visit: Payer: 59 | Admitting: Family Medicine

## 2022-10-31 ENCOUNTER — Encounter: Payer: Self-pay | Admitting: Family Medicine

## 2022-10-31 VITALS — BP 122/78 | HR 66 | Ht 66.0 in | Wt 159.0 lb

## 2022-10-31 DIAGNOSIS — R1013 Epigastric pain: Secondary | ICD-10-CM | POA: Diagnosis not present

## 2022-10-31 DIAGNOSIS — K219 Gastro-esophageal reflux disease without esophagitis: Secondary | ICD-10-CM | POA: Diagnosis not present

## 2022-10-31 MED ORDER — SUCRALFATE 1 G PO TABS: 1.0000 g | ORAL_TABLET | Freq: Three times a day (TID) | ORAL | 2 refills | Status: DC

## 2022-10-31 MED ORDER — OMEPRAZOLE 40 MG PO CPDR: 40.0000 mg | DELAYED_RELEASE_CAPSULE | Freq: Every day | ORAL | 2 refills | Status: DC

## 2022-10-31 NOTE — Progress Notes (Signed)
Subjective:    Patient ID: Sophia Schmidt, female    DOB: Nov 14, 1950, 72 y.o.   MRN: [id number]  Sophia Schmidt is a 72 y.o. female presenting on 10/31/2022 for Abdominal Pain (Started 3 weeks ago, diarrhea after eating, mother has history of pancreatic cancer)   HPI  Epigastric Pain  Reports symptoms onset 3 weeks ago with abdominal pain, mostly epigastric pain, worse with eating. And if not eating not having as much pain. Not endorsing nausea vomiting. She does endorse diarrhea after she eats. She has had difficulty with poor oral intake lately.  She still has her gallbladder. But has not been diagnosed with any problem before. No prior imaging with gallstones or other issue.  She has fam history of pancreatic cancer with mother.  Prior history GERD and no longer on PPI therapy.  She did not eat this morning  Taking Tylenol regularly. Not taking Ibuprofen or NSAID  Denies fever chills nausea vomiting dark stools blood in stool   Past Surgical History:  Procedure Laterality Date   ABDOMINAL HYSTERECTOMY  1998   partial   BREAST CYST ASPIRATION Left 10/17/2014   FNA done by Dr. Evette Cristal   BREAST EXCISIONAL BIOPSY Left 1998   x 2   BREAST SURGERY Left 1998   removal of 2 fibrocystic masses   COLONOSCOPY  2011   COLONOSCOPY WITH PROPOFOL N/A 12/13/2016   Procedure: COLONOSCOPY WITH PROPOFOL;  Surgeon: Wyline Mood, MD;  Location: [facility] ENDOSCOPY;  Service: Endoscopy;  Laterality: N/A;   COLONOSCOPY WITH PROPOFOL N/A 12/26/2021   Procedure: COLONOSCOPY WITH PROPOFOL;  Surgeon: Wyline Mood, MD;  Location: [facility] ENDOSCOPY;  Service: Gastroenterology;  Laterality: N/A;   ESOPHAGOGASTRODUODENOSCOPY (EGD) WITH PROPOFOL N/A 12/13/2016   Procedure: ESOPHAGOGASTRODUODENOSCOPY (EGD) WITH PROPOFOL;  Surgeon: Wyline Mood, MD;  Location: [facility] ENDOSCOPY;  Service: Endoscopy;  Laterality: N/A;   TONSILLECTOMY  1996       10/31/2022    8:23 AM 12/04/2020    3:19 PM 10/23/2020    2:22 PM   Depression screen PHQ 2/9  Decreased Interest 0 0 3  Down, Depressed, Hopeless 0 2 3  PHQ - 2 Score 0 2 6  Altered sleeping  2 0  Tired, decreased energy  2 3  Change in appetite  3   Feeling bad or failure about yourself   3 0  Trouble concentrating  0 0  Moving slowly or fidgety/restless  3 0  Suicidal thoughts  1 0  PHQ-9 Score  16 9  Difficult doing work/chores  Extremely dIfficult Not difficult at all    Social History   Tobacco Use   Smoking status: Never   Smokeless tobacco: Never  Vaping Use   Vaping Use: Never used  Substance Use Topics   Alcohol use: No    Alcohol/week: 0.0 standard drinks of alcohol   Drug use: No    Review of Systems Per HPI unless specifically indicated above     Objective:    BP 122/78   Pulse 66   Ht 5\' 6"  (1.676 m)   Wt 159 lb (72.1 kg)   SpO2 97%   BMI 25.66 kg/m   Wt Readings from Last 3 Encounters:  10/31/22 159 lb (72.1 kg)  05/15/22 160 lb (72.6 kg)  01/15/22 150 lb (68 kg)    Physical Exam Vitals and nursing note reviewed.  Constitutional:      General: She is not in acute distress.    Appearance: She is well-developed. She  is not diaphoretic.     Comments: Well-appearing, comfortable, cooperative  HENT:     Head: Normocephalic and atraumatic.  Eyes:     General:        Right eye: No discharge.        Left eye: No discharge.     Conjunctiva/sclera: Conjunctivae normal.  Neck:     Thyroid: No thyromegaly.  Cardiovascular:     Rate and Rhythm: Normal rate and regular rhythm.     Heart sounds: Normal heart sounds. No murmur heard. Pulmonary:     Effort: Pulmonary effort is normal. No respiratory distress.     Breath sounds: Normal breath sounds. No wheezing or rales.  Abdominal:     General: There is no distension.     Palpations: Abdomen is soft. There is no mass.     Tenderness: There is abdominal tenderness (epigastric). There is no guarding or rebound.     Hernia: No hernia is present.     Comments:  Hyperactive bowel sounds  Musculoskeletal:        General: Normal range of motion.     Cervical back: Normal range of motion and neck supple.  Lymphadenopathy:     Cervical: No cervical adenopathy.  Skin:    General: Skin is warm and dry.     Findings: No erythema or rash.  Neurological:     Mental Status: She is alert and oriented to person, place, and time.  Psychiatric:        Behavior: Behavior normal.     Comments: Well groomed, good eye contact, normal speech and thoughts      Results for orders placed or performed during the hospital encounter of 08/01/21  CBC  Result Value Ref Range   WBC 7.8 4.0 - 10.5 K/uL   RBC 4.19 3.87 - 5.11 MIL/uL   Hemoglobin 10.9 (L) 12.0 - 15.0 g/dL   HCT 52.8 (L) 41.3 - 24.4 %   MCV 80.4 80.0 - 100.0 fL   MCH 26.0 26.0 - 34.0 pg   MCHC 32.3 30.0 - 36.0 g/dL   RDW 01.0 27.2 - 53.6 %   Platelets 360 150 - 400 K/uL   nRBC 0.0 0.0 - 0.2 %  Basic metabolic panel  Result Value Ref Range   Sodium 141 135 - 145 mmol/L   Potassium 3.4 (L) 3.5 - 5.1 mmol/L   Chloride 111 98 - 111 mmol/L   CO2 23 22 - 32 mmol/L   Glucose, Bld 99 70 - 99 mg/dL   BUN 9 8 - 23 mg/dL   Creatinine, Ser 6.44 0.44 - 1.00 mg/dL   Calcium 03.4 (H) 8.9 - 10.3 mg/dL   GFR, Estimated >74 >25 mL/min   Anion gap 7 5 - 15      Assessment & Plan:   Problem List Items Addressed This Visit     GERD (gastroesophageal reflux disease)   Relevant Medications   sucralfate (CARAFATE) 1 g tablet   omeprazole (PRILOSEC) 40 MG capsule   Other Relevant Orders   H. pylori breath test   COMPLETE METABOLIC PANEL WITH GFR   CBC with Differential/Platelet   Other Visit Diagnoses     Epigastric abdominal pain    -  Primary   Relevant Medications   sucralfate (CARAFATE) 1 g tablet   omeprazole (PRILOSEC) 40 MG capsule   Other Relevant Orders   H. pylori breath test   COMPLETE METABOLIC PANEL WITH GFR   CBC with Differential/Platelet  Lipase       Suspect gastric ulcer  PUD / GERD as likely cause given symptoms and location of pain and past history Cannot rule out GB disease seems possible with postprandial symptoms Possible pancreatitis as well given pain only with eating Diarrhea present Mild dehydration  Plan:  1. Start prolonged PPI trial again Omeprazole 40mg  daily for 4 weeks, anticipate will need up to total 2-3 months if PUD 2. Today check H pylori breath test (given in office), confirmed off PPI >2 weeks now - if positive, will increase PPI to BID dosing, add on triple therapy antibiotics with amoxicillin (1g BID) and clarithromycin (500mg  BID) for 2 weeks 3. Labs with CMET CBC Lipase  Start carafate 1g TID WC + QHS PRN for symptom relief Strict return criteria given for worsening symptoms  Follow-up within 1-2 weeks, if no improvement, will refer to GI for further evaluation, may need future EGD given chronicity of problem. If gradual improvement can f/u in office within 4 weeks  Orders Placed This Encounter  Procedures   H. pylori breath test   COMPLETE METABOLIC PANEL WITH GFR   CBC with Differential/Platelet   Lipase     Meds ordered this encounter  Medications   sucralfate (CARAFATE) 1 g tablet    Sig: Take 1 tablet (1 g total) by mouth 4 (four) times daily -  with meals and at bedtime.    Dispense:  30 tablet    Refill:  2   omeprazole (PRILOSEC) 40 MG capsule    Sig: Take 1 capsule (40 mg total) by mouth daily.    Dispense:  30 capsule    Refill:  2      Follow up plan: Return if symptoms worsen or fail to improve.   Saralyn Pilar, DO [facility] 10/31/2022, 8:34 AM

## 2022-10-31 NOTE — Patient Instructions (Addendum)
Thank you for coming to the office today.  If not improving within 1 week, I would do the Ultrasound test for you.  I am not sure the exact cause of your abdominal pain, however I am concerned that one significant possibility could be uncontrolled Acid Reflux (GERD) and may have developed an Ulcer (Peptic Ulcer of stomach).  After you have completed your H Pylori Breath Test today, we can start with the prescribed medicines, and we will notify you of your result and if we have to change treatment.  Before starting the prescribed treatment you need to complete the H Pylori Breath Test, either back at our office as soon as possible (Lab is open morning only 8am to 12noon, need a lab only appointment scheduled, and you need to be FASTING for 4 hours before the test, NO FOOD OR DRINK, only small amount of water is fine, can take other medicines).  IF BREATH TEST = NEGATIVE (or waiting for results):  Starting tomorrow before breakfast Omeprazole 40mg . Prefer to take this med about 30 min before breakfast or 1st meal of day for 4 weeks, don't stop taking unless we discuss first. Probably will need for about 8 weeks total if this ends up being an Ulcer.   Take other prescribed medicine Carafate (Sucralfate) as needed up to 4 times daily (3 meals and bedtime)  to coat stomach lining to ease symptoms, if it helps reduce symptoms then it is more likely to be due to acid and/or ulcer.  IF BREATH TEST = POSITIVE (we notified you of this result): - CHANGE Protonix dose from 40mg  daily to 40mg  (one pill) TWICE daily, about 30 min before breakfast and dinner - FOR TWO WEEKS, then resume DAILY treatment - Also we will send in TWO antibiotics to take IN ADDITION:   DIET RECOMMENDATIONS - Avoid spicy, greasy, fried foods, also things like caffeine, dark chocolate, peppermint can worsen - Avoid large meals and late night snacks, also do not go more than 4-5 hours without a snack or meal (not eating will worsen  reflux symptoms due to stomach acid) - You may also elevate the head of your bed at night to sleep at very slight incline to help reduce symptoms   If the problem improves but keeps coming back, we can discuss higher dose or longer course at next visit.   If symptoms are worsening or persistent despite treatment or develop any different severe esophagus or abdominal pain, unable to swallow solids or liquids, nausea, vomiting especially blood in vomit, fever/chills, or unintentional weight loss / no appetite, please follow-up sooner in office or seek more immediate medical attention at hospital Emergency Department.  Regarding other medicines:   - STOP taking Ibuprofen, Advil, Motrin, Goody's / BC powder - DO NOT take without discussing with your doctor. These medicines can put you at high risk for future bleeding.  If need pain medicine, may take Tylenol Extra Strength (Acetaminophen) 500mg  tabs - take 1 to 2 tabs per dose (max 1000mg ) every 6-8 hours for pain (take regularly, don't skip a dose for next 7 days), max 24 hour daily dose is 6 tablets or 3000mg . In the future you can repeat the same everyday Tylenol course for 1-2 weeks at a time.   Please schedule a Follow-up Appointment to: Return if symptoms worsen or fail to improve.  If you have any other questions or concerns, please feel free to call the office or send a message through MyChart. You may also  schedule an earlier appointment if necessary.  Additionally, you may be receiving a survey about your experience at our office within a few days to 1 week by e-mail or mail. We value your feedback.  Saralyn Pilar, DO [facility], [place]

## 2022-11-04 ENCOUNTER — Ambulatory Visit: Payer: Self-pay

## 2022-11-04 DIAGNOSIS — K529 Noninfective gastroenteritis and colitis, unspecified: Secondary | ICD-10-CM

## 2022-11-04 DIAGNOSIS — R1013 Epigastric pain: Secondary | ICD-10-CM

## 2022-11-04 DIAGNOSIS — R112 Nausea with vomiting, unspecified: Secondary | ICD-10-CM

## 2022-11-04 NOTE — Telephone Encounter (Signed)
Lowella Bandy can you assist with STAT RUQ Abd US imaging order for [facility]?  --------------------  Called patient thsi afternoon 11/04/22 at 530pm  Reviewed updates. She still has symptoms now with single episode nausea vomiting and diarrhea post prandial but not constant symptoms. She is managing but said meds not resolving.  Waiting on H Pylori breath test still, says In Process  No recent imaging.  Chart review 08/2020 had gastritis in the past.  Will order RUQ Abd [place] given her postprandial symptoms and nausea vomiting and epigastric pain. Goal to rule out gallbladder disease.  Already had negative Lipase. Treated for PUD.  Pending RUQ [place] results, would next refer to GI for further management. Is a current patient of Dr Tobi Bastos.  Saralyn Pilar, DO [facility] 11/04/2022, 5:43 PM

## 2022-11-04 NOTE — Telephone Encounter (Signed)
  Chief Complaint: abdominal pain Symptoms: upper abdominal cramping, comes and goes, diarrhea and vomiting  Frequency: 2-3 days  Pertinent Negatives:NA Disposition: [] ED /[] Urgent Care (no appt availability in office) / [] Appointment(In office/virtual)/ []  [facility]/ [] Home Care/ [] Refused Recommended Disposition /[] [facility]/ [x]  Follow-up with PCP Additional Notes: pt had OV on 10/31/22 for same sx as above and pt still not feeling well. Waiting on H. Pylori test to come back per lab notes from Dr. Kirtland Bouchard. Pt states she is still taking both rxs but not noticing any difference. Advised pt since she just had recent OV would send message back to provider for further recommendations. Pt verbalized understanding.   Summary: stomach discomfort   The patient shares that they have continued to experience the stomach discomfort they have ben previously seen for  The patient shares that they have had diarrhea and nausea today 11/04/22  Please contact further when possible         Reason for Disposition  [1] MILD-MODERATE pain AND [2] constant AND [3] present > 2 hours  Answer Assessment - Initial Assessment Questions 1. LOCATION: "Where does it hurt?"      Upper stomach  3. ONSET: "When did the pain begin?" (e.g., minutes, hours or days ago)      Since  5. PATTERN "Does the pain come and go, or is it constant?"    - If it comes and goes: "How long does it last?" "Do you have pain now?"     (Note: Comes and goes means the pain is intermittent. It goes away completely between bouts.)    - If constant: "Is it getting better, staying the same, or getting worse?"      (Note: Constant means the pain never goes away completely; most serious pain is constant and gets worse.)      Comes and goes 6. SEVERITY: "How bad is the pain?"  (e.g., Scale 1-10; mild, moderate, or severe)    - MILD (1-3): Doesn't interfere with normal activities, abdomen soft and not tender to touch.     -  MODERATE (4-7): Interferes with normal activities or awakens from sleep, abdomen tender to touch.     - SEVERE (8-10): Excruciating pain, doubled over, unable to do any normal activities.       Cramping  10. OTHER SYMPTOMS: "Do you have any other symptoms?" (e.g., back pain, diarrhea, fever, urination pain, vomiting)       Diarrhea 2x and vomiting x 1  Protocols used: Abdominal Pain - Female-A-AH

## 2022-11-04 NOTE — Addendum Note (Signed)
Addended by: Smitty Cords on: 11/04/2022 05:46 PM   Modules accepted: Orders

## 2022-11-05 ENCOUNTER — Other Ambulatory Visit: Payer: Self-pay | Admitting: Family Medicine

## 2022-11-05 ENCOUNTER — Ambulatory Visit
Admission: RE | Admit: 2022-11-05 | Discharge: 2022-11-05 | Disposition: A | Discharge status: Home / Self Care | Payer: No Typology Code available for payment source | Source: Ambulatory Visit | Attending: Family Medicine | Admitting: Family Medicine

## 2022-11-05 ENCOUNTER — Other Ambulatory Visit: Payer: Self-pay

## 2022-11-05 ENCOUNTER — Other Ambulatory Visit: Payer: 59

## 2022-11-05 DIAGNOSIS — R112 Nausea with vomiting, unspecified: Secondary | ICD-10-CM | POA: Insufficient documentation

## 2022-11-05 DIAGNOSIS — R1013 Epigastric pain: Secondary | ICD-10-CM

## 2022-11-05 DIAGNOSIS — K219 Gastro-esophageal reflux disease without esophagitis: Secondary | ICD-10-CM

## 2022-11-05 DIAGNOSIS — K529 Noninfective gastroenteritis and colitis, unspecified: Secondary | ICD-10-CM

## 2022-11-05 LAB — COMPLETE METABOLIC PANEL WITH GFR
AG Ratio: 2 (calc) (ref 1.0–2.5)
ALT: 10 U/L (ref 6–29)
AST: 13 U/L (ref 10–35)
Albumin: 4.5 g/dL (ref 3.6–5.1)
Alkaline phosphatase (APISO): 107 U/L (ref 37–153)
BUN/Creatinine Ratio: 14 (calc) (ref 6–22)
BUN: 8 mg/dL (ref 7–25)
CO2: 27 mmol/L (ref 20–32)
Calcium: 10.9 mg/dL — ABNORMAL HIGH (ref 8.6–10.4)
Chloride: 109 mmol/L (ref 98–110)
Creat: 0.57 mg/dL — ABNORMAL LOW (ref 0.60–1.00)
Globulin: 2.3 g/dL (calc) (ref 1.9–3.7)
Glucose, Bld: 87 mg/dL (ref 65–99)
Potassium: 4.3 mmol/L (ref 3.5–5.3)
Sodium: 143 mmol/L (ref 135–146)
Total Bilirubin: 0.3 mg/dL (ref 0.2–1.2)
Total Protein: 6.8 g/dL (ref 6.1–8.1)
eGFR: 98 mL/min/{1.73_m2} (ref >=60)

## 2022-11-05 LAB — CBC WITH DIFFERENTIAL/PLATELET
Absolute Monocytes: 289 cells/uL (ref 200–950)
Basophils Absolute: 59 cells/uL (ref 0–200)
Basophils Relative: 1.2 %
Eosinophils Absolute: 211 cells/uL (ref 15–500)
Eosinophils Relative: 4.3 %
HCT: 38.1 % (ref 35.0–45.0)
Hemoglobin: 12.3 g/dL (ref 11.7–15.5)
Lymphs Abs: 1642 cells/uL (ref 850–3900)
MCH: 26.7 pg — ABNORMAL LOW (ref 27.0–33.0)
MCHC: 32.3 g/dL (ref 32.0–36.0)
MCV: 82.6 fL (ref 80.0–100.0)
MPV: 10.8 fL (ref 7.5–12.5)
Monocytes Relative: 5.9 %
Neutro Abs: 2700 cells/uL (ref 1500–7800)
Neutrophils Relative %: 55.1 %
Platelets: 298 10*3/uL (ref 140–400)
RBC: 4.61 10*6/uL (ref 3.80–5.10)
RDW: 13.9 % (ref 11.0–15.0)
Total Lymphocyte: 33.5 %
WBC: 4.9 10*3/uL (ref 3.8–10.8)

## 2022-11-05 LAB — LIPASE: Lipase: 19 U/L (ref 7–60)

## 2022-11-05 LAB — H. PYLORI BREATH TEST

## 2022-11-12 LAB — H. PYLORI BREATH TEST: H. pylori Breath Test: NOT DETECTED

## 2022-11-26 ENCOUNTER — Ambulatory Visit (INDEPENDENT_AMBULATORY_CARE_PROVIDER_SITE_OTHER): Payer: No Typology Code available for payment source | Admitting: Physician Assistant

## 2022-11-26 ENCOUNTER — Encounter: Payer: Self-pay | Admitting: Physician Assistant

## 2022-11-26 VITALS — BP 164/90 | HR 52 | Temp 98.1°F | Ht 65.0 in | Wt 161.2 lb

## 2022-11-26 DIAGNOSIS — K449 Diaphragmatic hernia without obstruction or gangrene: Secondary | ICD-10-CM | POA: Diagnosis not present

## 2022-11-26 DIAGNOSIS — K219 Gastro-esophageal reflux disease without esophagitis: Secondary | ICD-10-CM

## 2022-11-26 DIAGNOSIS — K581 Irritable bowel syndrome with constipation: Secondary | ICD-10-CM

## 2022-11-26 DIAGNOSIS — R1013 Epigastric pain: Secondary | ICD-10-CM | POA: Diagnosis not present

## 2022-11-26 MED ORDER — HYOSCYAMINE SULFATE 0.125 MG PO TABS: 0.1250 mg | ORAL_TABLET | ORAL | 1 refills | Status: DC | PRN

## 2022-11-26 MED ORDER — ALIGN 4 MG PO CAPS: ORAL_CAPSULE | ORAL | Status: DC

## 2022-11-26 MED ORDER — OMEPRAZOLE 40 MG PO CPDR: 40.0000 mg | DELAYED_RELEASE_CAPSULE | Freq: Every day | ORAL | 2 refills | Status: DC

## 2022-11-26 MED ORDER — SUCRALFATE 1 G PO TABS: 1.0000 g | ORAL_TABLET | Freq: Three times a day (TID) | ORAL | 2 refills | Status: DC

## 2022-11-26 NOTE — Progress Notes (Signed)
Celso Amy, PA-C [address]  [address]  [place], [place] [address]  Main: [phone]  Fax: [phone]   Gastroenterology Consultation  Referring Provider:     Saralyn Pilar * Primary Care Physician:  Smitty Cords, DO Primary Gastroenterologist:  Dr. Wyline Mood / Celso Amy, PA-C  Reason for Consultation:     Epigastric Pain, GERD        HPI:   Sophia Schmidt is a 72 y.o. y/o female referred for consultation & management  by Smitty Cords, DO.    Referred to evaluate epigastric pain and GERD.  She saw her PCP for evaluation of epigastric pain 10/31/2022.  Was started on omeprazole 40 Mg once daily and sucralfate 1 g 4 times daily as needed.  Patient states she has no more epigastric pain, nausea, or vomiting.  However, she has had bilateral lower abdominal cramping with irregular bowel habits.  Several weeks ago she had an episode of severe diarrhea which resolved.  After that she developed lower abdominal cramping located in the RLQ and LLQ.  She feels more constipation now.  Denies melena, hematochezia, or weight loss.  She is having small hard BMs after eating.  No current treatment for constipation.  10/31/2022 showed normal CMP, CBC, and lipase.  H. pylori breath test negative.  RUQ ultrasound 11/05/2022 showed no acute abnormality.  Incidental increased echogenicity of the right kidney.  Normal gallbladder, no gallstones.  EGD 11/2016 showed 4 cm hiatal hernia, otherwise normal.  She was on PPI for GERD.  Last colonoscopy 12/2021 showed diverticulosis, otherwise normal.  No polyps.  Repeat in 10 years.    Past Medical History:  Diagnosis Date   Anemia    GERD (gastroesophageal reflux disease)     Past Surgical History:  Procedure Laterality Date   ABDOMINAL HYSTERECTOMY  1998   partial   BREAST CYST ASPIRATION Left 10/17/2014   FNA done by Dr. Evette Cristal   BREAST EXCISIONAL BIOPSY Left 1998   x 2   BREAST SURGERY Left 1998    removal of 2 fibrocystic masses   COLONOSCOPY  2011   COLONOSCOPY WITH PROPOFOL N/A 12/13/2016   Procedure: COLONOSCOPY WITH PROPOFOL;  Surgeon: Wyline Mood, MD;  Location: [facility] ENDOSCOPY;  Service: Endoscopy;  Laterality: N/A;   COLONOSCOPY WITH PROPOFOL N/A 12/26/2021   Procedure: COLONOSCOPY WITH PROPOFOL;  Surgeon: Wyline Mood, MD;  Location: [facility] ENDOSCOPY;  Service: Gastroenterology;  Laterality: N/A;   ESOPHAGOGASTRODUODENOSCOPY (EGD) WITH PROPOFOL N/A 12/13/2016   Procedure: ESOPHAGOGASTRODUODENOSCOPY (EGD) WITH PROPOFOL;  Surgeon: Wyline Mood, MD;  Location: [facility] ENDOSCOPY;  Service: Endoscopy;  Laterality: N/A;   TONSILLECTOMY  1996    Prior to Admission medications   Medication Sig Start Date End Date Taking? Authorizing Provider  albuterol (VENTOLIN HFA) 108 (90 Base) MCG/ACT inhaler Inhale 2 puffs into the lungs every 4 (four) hours as needed for wheezing or shortness of breath (cough). 05/15/22  Yes Karamalegos, Netta Neat, DO  ferrous sulfate 325 (65 FE) MG tablet Take 1 tablet by mouth daily. 08/22/20  Yes [provider]  omeprazole (PRILOSEC) 40 MG capsule Take 1 capsule (40 mg total) by mouth daily. 10/31/22  Yes Karamalegos, Netta Neat, DO  sucralfate (CARAFATE) 1 g tablet Take 1 tablet (1 g total) by mouth 4 (four) times daily -  with meals and at bedtime. 10/31/22  Yes Smitty Cords, DO    Family History  Problem Relation Age of Onset   Cancer Mother  pancreatic   Alzheimer's disease Father    Cancer Sister        lung   Cancer Brother        lung   Breast cancer Sister        70   Colon cancer Neg Hx      Social History   Tobacco Use   Smoking status: Never   Smokeless tobacco: Never  Vaping Use   Vaping Use: Never used  Substance Use Topics   Alcohol use: No    Alcohol/week: 0.0 standard drinks of alcohol   Drug use: No    Allergies as of 11/26/2022 - Review Complete 11/26/2022  Allergen Reaction Noted   Penicillins Rash  10/20/2020    Review of Systems:    All systems reviewed and negative except where noted in HPI.   Physical Exam:  BP (!) 164/90   Pulse (!) 52   Temp 98.1 F (36.7 C)   Ht 5\' 5"  (1.651 m)   Wt 161 lb 3.2 oz (73.1 kg)   BMI 26.83 kg/m  No LMP recorded. Patient has had a hysterectomy. Psych:  Alert and cooperative. Normal mood and affect. General:   Alert,  Well-developed, well-nourished, pleasant and cooperative in NAD Head:  Normocephalic and atraumatic. Eyes:  Sclera clear, no icterus.   Conjunctiva pink. Neck:  Supple; no masses or thyromegaly. Lungs:  Respirations even and unlabored.  Clear throughout to auscultation.   No wheezes, crackles, or rhonchi. No acute distress. Heart:  Regular rate and rhythm; no murmurs, clicks, rubs, or gallops. Abdomen:  Normal bowel sounds.  No bruits.  Soft, and non-distended without masses, hepatosplenomegaly or hernias noted.  No Tenderness.  No guarding or rebound tenderness.    Neurologic:  Alert and oriented x3;  grossly normal neurologically. Psych:  Alert and cooperative. Normal mood and affect.  Imaging Studies: US ABDOMEN LIMITED RUQ (LIVER/GB)  Result Date: 11/05/2022 CLINICAL DATA:  Right upper quadrant abdominal pain. Postprandial nausea and vomiting. EXAM: ULTRASOUND ABDOMEN LIMITED RIGHT UPPER QUADRANT COMPARISON:  None Available. FINDINGS: Gallbladder: No gallstones or wall thickening visualized. No sonographic Murphy sign noted by sonographer. Common bile duct: Diameter: 7 mm, normal. Liver: No focal lesion identified. Within normal limits in parenchymal echogenicity. Portal vein is patent on color Doppler imaging with normal direction of blood flow towards the liver. Other: Increased echogenicity of the right kidney. IMPRESSION: 1. No acute abnormality. 2. Increased echogenicity of the right kidney, suggestive of medical renal disease. Electronically Signed   By: Obie Dredge M.D.   On: 11/05/2022 15:32    Assessment and Plan:    Sophia Schmidt is a 72 y.o. y/o female has been referred for GERD and IBS symptoms.  GERD has improved on pantoprazole and sucralfate.  I suspect she had a virus several weeks ago and now having mild postinfectious IBS symptoms.  And giving trial of treatment with follow-up.  She has no alarm symptoms.  GERD Continue pantoprazole and sucralfate Recommend Lifestyle Modifications to prevent Acid Reflux.  Rec. Avoid coffee, sodas, peppermint, citrus fruits, and spicey foods.  Avoid eating 2-3 hours before bedtime.   Hiatal Hernia  Post-Infectious IBS  Start align probiotic 1 capsule once daily.  Start Benefiber powder 1 tablespoon in a drink once daily. Rx hyoscyamine 0.125 mg 1 tab 3 times daily before meals as needed for Abdominal cramping.    Follow up in 4 weeks with TG  Celso Amy, PA-C

## 2022-11-27 ENCOUNTER — Encounter: Payer: Self-pay | Admitting: Physician Assistant

## 2022-12-17 NOTE — Progress Notes (Unsigned)
Celso Amy, PA-C [address]  [address]  [place], [place] [address]  Main: [phone]  Fax: [phone]   Primary Care Physician: Smitty Cords, DO  Primary Gastroenterologist:  Celso Amy, PA-C / Dr. Wyline Mood    CC: 4-week follow-up of GERD and IBS.  HPI: Sophia Schmidt is a 72 y.o. female returns for follow-up of GERD and IBS.  Currently taking omeprazole 40 Mg daily, sucralfate 1 g 4 times daily.  Also started on hyoscyamine, Benefiber, and align probiotic for IBS.  Current symptoms: Her upper GI symptoms have improved.  She has no more epigastric pain or heartburn on omeprazole and sucralfate.  She continues to have lower abdominal cramping with loose stools and diarrhea.  Having 3 or 4 loose stools per day.  GI symptoms started this year.  Denies rectal bleeding.  She is having some weight loss because she is afraid to eat due to the diarrhea.  No recent antibiotic use.  She did not have any side effects with taking hyosciamine.  Lab 10/31/2022 showed normal CMP, CBC, and lipase.  H. pylori breath test negative.   RUQ ultrasound 11/05/2022 showed no acute abnormality.  Incidental increased echogenicity of the right kidney.  Normal gallbladder, no gallstones.   EGD 11/2016 showed 4 cm hiatal hernia, otherwise normal.  She was on PPI for GERD.   Last colonoscopy 12/2021 showed diverticulosis, otherwise normal.  No polyps.  Repeat in 10 years.   Current Outpatient Medications  Medication Sig Dispense Refill   albuterol (VENTOLIN HFA) 108 (90 Base) MCG/ACT inhaler Inhale 2 puffs into the lungs every 4 (four) hours as needed for wheezing or shortness of breath (cough). 1 each 5   Bacillus Coagulans-Inulin (ALIGN PREBIOTIC-PROBIOTIC PO) Take by mouth.     dicyclomine (BENTYL) 10 MG capsule Take 1 capsule (10 mg total) by mouth 3 (three) times daily before meals. 90 capsule 2   ferrous sulfate 325 (65 FE) MG tablet Take 1 tablet by mouth daily.     Probiotic  Product (ALIGN) 4 MG CAPS Take 1 capsule once daily for 30 days.     omeprazole (PRILOSEC) 40 MG capsule Take 1 capsule (40 mg total) by mouth daily. 30 capsule 2   sucralfate (CARAFATE) 1 g tablet Take 1 tablet (1 g total) by mouth 4 (four) times daily -  with meals and at bedtime. 30 tablet 2   No current facility-administered medications for this visit.    Allergies as of 12/18/2022 - Review Complete 12/18/2022  Allergen Reaction Noted   Penicillins Rash 10/20/2020    Past Medical History:  Diagnosis Date   Anemia    GERD (gastroesophageal reflux disease)     Past Surgical History:  Procedure Laterality Date   ABDOMINAL HYSTERECTOMY  1998   partial   BREAST CYST ASPIRATION Left 10/17/2014   FNA done by Dr. Evette Cristal   BREAST EXCISIONAL BIOPSY Left 1998   x 2   BREAST SURGERY Left 1998   removal of 2 fibrocystic masses   COLONOSCOPY  2011   COLONOSCOPY WITH PROPOFOL N/A 12/13/2016   Procedure: COLONOSCOPY WITH PROPOFOL;  Surgeon: Wyline Mood, MD;  Location: [facility] ENDOSCOPY;  Service: Endoscopy;  Laterality: N/A;   COLONOSCOPY WITH PROPOFOL N/A 12/26/2021   Procedure: COLONOSCOPY WITH PROPOFOL;  Surgeon: Wyline Mood, MD;  Location: [facility] ENDOSCOPY;  Service: Gastroenterology;  Laterality: N/A;   ESOPHAGOGASTRODUODENOSCOPY (EGD) WITH PROPOFOL N/A 12/13/2016   Procedure: ESOPHAGOGASTRODUODENOSCOPY (EGD) WITH PROPOFOL;  Surgeon: Wyline Mood,  MD;  Location: [facility] ENDOSCOPY;  Service: Endoscopy;  Laterality: N/A;   TONSILLECTOMY  1996    Review of Systems:    All systems reviewed and negative except where noted in HPI.   Physical Examination:   BP 134/85 (BP Location: Left Arm, Patient Position: Sitting, Cuff Size: Normal)   Pulse 61   Temp 98.4 F (36.9 C) (Oral)   Ht 5\' 5"  (1.651 m)   Wt 157 lb (71.2 kg)   BMI 26.13 kg/m   General: Well-nourished, well-developed in no acute distress.  Eyes: No icterus. Conjunctivae pink. Mouth: Oropharyngeal mucosa moist and pink , no  lesions erythema or exudate. Lungs: Clear to auscultation bilaterally. Non-labored. Heart: Regular rate and rhythm, no murmurs rubs or gallops.  Abdomen: Bowel sounds are normal; Abdomen is Soft; No hepatosplenomegaly, masses or hernias; there is moderate bilateral lower abdominal Tenderness; no upper abdominal tenderness; no guarding or rebound tenderness. Extremities: No lower extremity edema. No clubbing or deformities. Neuro: Alert and oriented x 3.  Grossly intact. Skin: Warm and dry, no jaundice.   Psych: Alert and cooperative, normal mood and affect.   Imaging Studies: No results found.  Assessment and Plan:   Sophia Schmidt is a 72 y.o. y/o female returns for follow-up of epigastric pain, GERD, diarrhea, and lower abdominal cramping.  In the past month her upper GI symptoms improved on omeprazole and sucralfate.  She continues to have diarrhea and lower abdominal pain, not improved on hyoscyamine and align.  I am ordering stool studies and abdominal pelvic CT for evaluation.  Rule out infectious diarrhea, EPI, and IBD.  Stop hyoscyamine and switch to dicyclomine to see if this works better.  1.  GERD - Greatly improved on Meds  Continue omeprazole 40 Mg daily.   Continue sucralfate 1 g 3 times daily as needed.  2.  Epigastric pain - Greatly Improved / Resolved  Continue omeprazole and sucralfate.   3.  Diarrhea  Stool Studies: GI pathogen panel, C. Difficile toxin PCR, Pancreatic fecal elastase, fecal calprotectin.  4.  Lower abdominal Pain (bilateral) with tenderness  CT Abd / Pelvis with contrast  Stop hyoscyamine and align probiotic - not helping.   Start dicyclomine 10 Mg 3 times daily.  Abdominal cramping.   Celso Amy, PA-C  Follow up in 4 weeks with TG.

## 2022-12-18 ENCOUNTER — Ambulatory Visit (INDEPENDENT_AMBULATORY_CARE_PROVIDER_SITE_OTHER): Payer: No Typology Code available for payment source | Admitting: Physician Assistant

## 2022-12-18 ENCOUNTER — Encounter: Payer: Self-pay | Admitting: Physician Assistant

## 2022-12-18 VITALS — BP 134/85 | HR 61 | Temp 98.4°F | Ht 65.0 in | Wt 157.0 lb

## 2022-12-18 DIAGNOSIS — R1084 Generalized abdominal pain: Secondary | ICD-10-CM | POA: Diagnosis not present

## 2022-12-18 DIAGNOSIS — R1013 Epigastric pain: Secondary | ICD-10-CM | POA: Diagnosis not present

## 2022-12-18 DIAGNOSIS — K219 Gastro-esophageal reflux disease without esophagitis: Secondary | ICD-10-CM | POA: Diagnosis not present

## 2022-12-18 DIAGNOSIS — R197 Diarrhea, unspecified: Secondary | ICD-10-CM

## 2022-12-18 DIAGNOSIS — R634 Abnormal weight loss: Secondary | ICD-10-CM | POA: Diagnosis not present

## 2022-12-18 MED ORDER — SUCRALFATE 1 G PO TABS: 1.0000 g | ORAL_TABLET | Freq: Three times a day (TID) | ORAL | 2 refills | Status: DC

## 2022-12-18 MED ORDER — OMEPRAZOLE 40 MG PO CPDR: 40.0000 mg | DELAYED_RELEASE_CAPSULE | Freq: Every day | ORAL | 2 refills | Status: DC

## 2022-12-18 MED ORDER — DICYCLOMINE HCL 10 MG PO CAPS: 10.0000 mg | ORAL_CAPSULE | Freq: Three times a day (TID) | ORAL | 2 refills | Status: DC

## 2022-12-18 NOTE — Patient Instructions (Addendum)
Your CT scan is schedule for December 25, 2022, arrive at 4:15 PM to register   [facility], [facility] entrance.  If you need to cancel or reschedule, please call [phone]

## 2022-12-19 LAB — BASIC METABOLIC PANEL
BUN/Creatinine Ratio: 12 (ref 12–28)
BUN: 7 mg/dL — ABNORMAL LOW (ref 8–27)
CO2: 22 mmol/L (ref 20–29)
Calcium: 10.4 mg/dL — ABNORMAL HIGH (ref 8.7–10.3)
Chloride: 105 mmol/L (ref 96–106)
Creatinine, Ser: 0.58 mg/dL (ref 0.57–1.00)
Glucose: 80 mg/dL (ref 70–99)
Potassium: 3.4 mmol/L — ABNORMAL LOW (ref 3.5–5.2)
Sodium: 142 mmol/L (ref 134–144)
eGFR: 97 mL/min/{1.73_m2} (ref >59)

## 2022-12-25 ENCOUNTER — Ambulatory Visit
Admission: RE | Admit: 2022-12-25 | Discharge: 2022-12-25 | Disposition: A | Discharge status: Home / Self Care | Payer: No Typology Code available for payment source | Source: Ambulatory Visit | Attending: Physician Assistant | Admitting: Physician Assistant

## 2022-12-25 DIAGNOSIS — K449 Diaphragmatic hernia without obstruction or gangrene: Secondary | ICD-10-CM | POA: Diagnosis not present

## 2022-12-25 DIAGNOSIS — D1771 Benign lipomatous neoplasm of kidney: Secondary | ICD-10-CM | POA: Diagnosis not present

## 2022-12-25 DIAGNOSIS — R1903 Right lower quadrant abdominal swelling, mass and lump: Secondary | ICD-10-CM | POA: Diagnosis not present

## 2022-12-25 DIAGNOSIS — R109 Unspecified abdominal pain: Secondary | ICD-10-CM | POA: Diagnosis not present

## 2022-12-25 DIAGNOSIS — R1084 Generalized abdominal pain: Secondary | ICD-10-CM | POA: Diagnosis not present

## 2022-12-25 MED ORDER — IOHEXOL 300 MG/ML  SOLN
80.0000 mL | Freq: Once | INTRAMUSCULAR | Status: AC | PRN
  Administered 2022-12-25: 80 mL via INTRAVENOUS

## 2022-12-26 ENCOUNTER — Telehealth: Payer: Self-pay

## 2022-12-26 DIAGNOSIS — H25811 Combined forms of age-related cataract, right eye: Secondary | ICD-10-CM | POA: Diagnosis not present

## 2022-12-26 NOTE — Telephone Encounter (Signed)
Left detailed message on VM.   Patient has not checked results in MyChart.  Please call patient and notify:  Your kidney function is excellent.  No evidence of dehydration.  Potassium is mildly low.  Please consume potassium rich foods and drinks such as bananas, orange juice, tomatoes, baked potato and green leafy vegetables.  Her C. difficile stool test was negative.  No C. difficile infection.  We are still waiting on other stool test results.

## 2022-12-26 NOTE — Progress Notes (Signed)
Patient has not checked results in MyChart.  Please call patient and notify: Your kidney function is excellent.  No evidence of dehydration.  Potassium is mildly low.  Please consume potassium rich foods and drinks such as bananas, orange juice, tomatoes, baked potato and green leafy vegetables.  Her C. difficile stool test was negative.  No C. difficile infection.  We are still waiting on other stool test results.

## 2022-12-29 NOTE — Progress Notes (Signed)
Notify patient stool test show: 1.  GI pathogen panel is negative for all infections. 2.  C. difficile stool test is negative.  No evidence of C. difficile infection. 3.  Fecal pancreatic elastase is low.  This is consistent with pancreatic insufficiency which can cause diarrhea.  I recommend she start [name] 36,000 lipase units, take 2 capsules with each meal and 1 with each snack.  Give samples if we have them.  Also okay to give 30-day prescription with 2 refills.  Keep follow-up as scheduled.

## 2022-12-29 NOTE — Progress Notes (Signed)
Notify patient abdominal pelvic CT shows no acute abnormality.  No evidence of cancer or masses.  There is a moderate hiatal hernia which can cause acid reflux.  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  No further follow-up is recommended.  Continue omeprazole and sucralfate for heartburn and acid reflux.

## 2022-12-30 ENCOUNTER — Telehealth: Payer: Self-pay

## 2022-12-30 ENCOUNTER — Telehealth: Payer: Self-pay | Admitting: Physician Assistant

## 2022-12-30 LAB — GI PROFILE, STOOL, PCR

## 2022-12-30 LAB — PANCREATIC ELASTASE, FECAL: Pancreatic Elastase, Fecal: 177 ug Elast./g — ABNORMAL LOW (ref >200)

## 2022-12-30 LAB — CLOSTRIDIUM DIFFICILE BY PCR: Toxigenic C. Difficile by PCR: NEGATIVE

## 2022-12-30 LAB — CALPROTECTIN, FECAL: Calprotectin, Fecal: 118 ug/g (ref 0–120)

## 2022-12-30 MED ORDER — PANCRELIPASE (LIP-PROT-AMYL) 36000-114000 UNITS PO CPEP: ORAL_CAPSULE | ORAL | 11 refills | Status: DC

## 2022-12-30 NOTE — Telephone Encounter (Signed)
Patient wanted her AVS printed off.

## 2022-12-30 NOTE — Telephone Encounter (Signed)
Patient notified.  Fecal calprotectin test is normal.  No evidence of bowel inflammation or IBD.

## 2022-12-30 NOTE — Progress Notes (Signed)
Fecal calprotectin test is normal.  No evidence of bowel inflammation or IBD.

## 2022-12-30 NOTE — Telephone Encounter (Signed)
1.  GI pathogen panel is negative for all infections.  2.  C. difficile stool test is negative.  No evidence of C. difficile infection.  3.  Fecal pancreatic elastase is low.  This is consistent with pancreatic insufficiency which can cause diarrhea.  I recommend she start [name] 36,000 lipase units, take 2 capsules with each meal and 1 with each snack.  Give samples if we have them.  Also okay to give 30-day prescription with 2 refills.  Keep follow-up as scheduled.   Notify patient abdominal pelvic CT shows no acute abnormality.  No evidence of cancer or masses.  There is a moderate hiatal hernia which can cause acid reflux.  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  No further follow-up is recommended.  Continue omeprazole and sucralfate for heartburn and acid reflux.

## 2023-01-22 ENCOUNTER — Ambulatory Visit: Payer: Self-pay

## 2023-01-22 NOTE — Telephone Encounter (Signed)
Pt advised.   Thanks,   -Giovannie Scerbo  

## 2023-01-22 NOTE — Telephone Encounter (Signed)
Agree with advice given. Monitor for symptoms. If she develops any symptoms she can let us know but this is a viral infection and all we can do is treat her symptoms should she develop them.

## 2023-01-22 NOTE — Telephone Encounter (Signed)
Pt requests a return call from a nurse because she was around someone with RSV. Pt reports that she is not having any symptoms at the moment but is concerned and would like to know what she should do. Cb# [phone]    Chief Complaint: Exposure to RSV, no symptoms. Pt. Exposed Monday from daughter. Reviewed signs and symptoms with pt. Wants to know from PCP what else she can do. Symptoms: No symptoms. Frequency: Monday Pertinent Negatives: Patient denies any symptoms. Disposition: [] ED /[] Urgent Care (no appt availability in office) / [] Appointment(In office/virtual)/ []  [facility]/ [] Home Care/ [] Refused Recommended Disposition /[] [facility]/ [x]  Follow-up with PCP Additional Notes: Pt. Will observe for respiratory symptoms. Please advise pt.  Reason for Disposition  [1] Caller requests to speak ONLY to PCP AND [2] NON-URGENT question  Answer Assessment - Initial Assessment Questions 1. REASON FOR CALL or QUESTION: "What is your reason for calling today?" or "How can I best help you?" or "What question do you have that I can help answer?"     Pt. Exposed to RSV Monday, daughter diagnosed yesterday. 2. CALLER: Document the source of call. (e.g., laboratory, patient).     Patient  Protocols used: PCP Call - No Triage-A-AH

## 2023-02-05 NOTE — Progress Notes (Signed)
Celso Amy, PA-C [address]  [address]  [place], [place] [address]  Main: [phone]  Fax: [phone]   Primary Care Physician: Smitty Cords, DO  Primary Gastroenterologist:  Celso Amy, PA-C / Dr. Wyline Mood    CC: Follow-up chronic diarrhea, pancreatic insufficiency, IBS  HPI: Sophia Schmidt is a 72 y.o. female returns for 6-week follow-up.  Recent GI pathogen panel and C. difficile toxin PCR stool studies were negative for all infections.  Fecal calprotectin was normal.  Fecal pancreatic elastase was low, consistent with pancreatic insufficiency.  She started [name] 36,000 lipase units 2 with each meal and 1 with each snack.  Has also been taking dicyclomine 10 Mg 3 times daily as needed abdominal cramping.  On treatment she is not having any more diarrhea or abdominal cramping.  Her GI symptoms completely resolved and she is feeling a lot better.  Epigastric pain and acid reflux improved on omeprazole and sucralfate.  No longer taking sucralfate.  She is just taking omeprazole 40 Mg once daily with good control of reflux.  Denies any upper GI symptoms at present.  Of note, her mother died from pancreatic cancer.  Patient is very relieved about her recent normal abdominal pelvic CT.  -H. pylori breath test was Negative 10/2022.  -Abdominal pelvic CT 12/27/2022 showed no acute abnormality.  Moderate hiatal hernia.  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  -Lab 10/31/2022 showed normal CMP, CBC, and lipase.  H. pylori breath test negative.   -RUQ ultrasound 11/05/2022 showed no acute abnormality.  Incidental increased echogenicity of the right kidney.  Normal gallbladder, no gallstones.   -EGD 11/2016 showed 4 cm hiatal hernia, otherwise normal.  She was on PPI for GERD.  -Last Colonoscopy 12/2021 showed diverticulosis, otherwise normal.  No polyps.  Repeat in 10 years.   Current Outpatient Medications  Medication Sig Dispense  Refill   albuterol (VENTOLIN HFA) 108 (90 Base) MCG/ACT inhaler Inhale 2 puffs into the lungs every 4 (four) hours as needed for wheezing or shortness of breath (cough). 1 each 5   Bacillus Coagulans-Inulin (ALIGN PREBIOTIC-PROBIOTIC PO) Take by mouth.     dicyclomine (BENTYL) 10 MG capsule Take 1 capsule (10 mg total) by mouth 3 (three) times daily before meals. 90 capsule 2   ferrous sulfate 325 (65 FE) MG tablet Take 1 tablet by mouth daily.     lipase/protease/amylase ([name]) 36000 UNITS CPEP capsule Take 2 capsules (72,000 Units total) by mouth 3 (three) times daily with meals. May also take 1 capsule (36,000 Units total) as needed (with snacks). 240 capsule 11   omeprazole (PRILOSEC) 40 MG capsule Take 1 capsule (40 mg total) by mouth daily. 30 capsule 2   Probiotic Product (ALIGN) 4 MG CAPS Take 1 capsule once daily for 30 days.     sucralfate (CARAFATE) 1 g tablet Take 1 tablet (1 g total) by mouth 4 (four) times daily -  with meals and at bedtime. 30 tablet 2   No current facility-administered medications for this visit.    Allergies as of 02/06/2023 - Review Complete 02/06/2023  Allergen Reaction Noted   Penicillins Rash 10/20/2020    Past Medical History:  Diagnosis Date   Anemia    GERD (gastroesophageal reflux disease)     Past Surgical History:  Procedure Laterality Date   ABDOMINAL HYSTERECTOMY  1998   partial   BREAST CYST ASPIRATION Left 10/17/2014   FNA done by Dr. Evette Cristal   BREAST  EXCISIONAL BIOPSY Left 1998   x 2   BREAST SURGERY Left 1998   removal of 2 fibrocystic masses   COLONOSCOPY  2011   COLONOSCOPY WITH PROPOFOL N/A 12/13/2016   Procedure: COLONOSCOPY WITH PROPOFOL;  Surgeon: Wyline Mood, MD;  Location: [facility] ENDOSCOPY;  Service: Endoscopy;  Laterality: N/A;   COLONOSCOPY WITH PROPOFOL N/A 12/26/2021   Procedure: COLONOSCOPY WITH PROPOFOL;  Surgeon: Wyline Mood, MD;  Location: [facility] ENDOSCOPY;  Service: Gastroenterology;  Laterality: N/A;    ESOPHAGOGASTRODUODENOSCOPY (EGD) WITH PROPOFOL N/A 12/13/2016   Procedure: ESOPHAGOGASTRODUODENOSCOPY (EGD) WITH PROPOFOL;  Surgeon: Wyline Mood, MD;  Location: [facility] ENDOSCOPY;  Service: Endoscopy;  Laterality: N/A;   TONSILLECTOMY  1996    Review of Systems:    All systems reviewed and negative except where noted in HPI.   Physical Examination:   BP (!) 160/90   Pulse 71   Temp 98.1 F (36.7 C)   Ht 5\' 3"  (1.6 m)   Wt 157 lb (71.2 kg)   BMI 27.81 kg/m   General: Well-nourished, well-developed in no acute distress.  Neuro: Alert and oriented x 3.  Grossly intact.  Psych: Alert and cooperative, normal mood and affect.  Imaging Studies: See HPI.  Assessment and Plan:   Sophia Schmidt is a 72 y.o. y/o female returns for follow-up of chronic diarrhea.  Attributed to pancreatic insufficiency and IBS.  Recent stool studies negative for infections.  Fecal pancreatic elastase was low, consistent with pancreatic insufficiency.  She has been taking [name] with great benefit.  She has no more diarrhea.  GERD and epigastric pain are controlled on omeprazole.  1.  Exocrine pancreatic insufficiency  Continue [name] 36,000 lipase units 2 with each meal and 1 with each snack  2.  Irritable bowel syndrome with diarrhea  Continue dicyclomine 10mg  TID as needed for abdominal cramping.  We discussed decreasing dicyclomine to lowest effective dose as necessary.  3.  Medium hiatal hernia with GERD, controlled on omeprazole.  H. pylori breath test negative.    Continue Omeprazole 40mg  1 tablet once daily.  Stop Sucralafate - Not needed.  4.  Abdominal pain attributed to IBS  Reassurance regarding recent abdominal pelvic CT which showed no acute abnormality.    Celso Amy, PA-C  Follow up in 6 or sooner if she has recurrent GI symptoms.

## 2023-02-06 ENCOUNTER — Encounter: Payer: Self-pay | Admitting: Physician Assistant

## 2023-02-06 ENCOUNTER — Ambulatory Visit (INDEPENDENT_AMBULATORY_CARE_PROVIDER_SITE_OTHER): Payer: No Typology Code available for payment source | Admitting: Physician Assistant

## 2023-02-06 VITALS — BP 160/90 | HR 71 | Temp 98.1°F | Ht 63.0 in | Wt 157.0 lb

## 2023-02-06 DIAGNOSIS — K219 Gastro-esophageal reflux disease without esophagitis: Secondary | ICD-10-CM

## 2023-02-06 DIAGNOSIS — R1013 Epigastric pain: Secondary | ICD-10-CM | POA: Diagnosis not present

## 2023-02-06 DIAGNOSIS — K8681 Exocrine pancreatic insufficiency: Secondary | ICD-10-CM | POA: Diagnosis not present

## 2023-02-06 MED ORDER — PANCRELIPASE (LIP-PROT-AMYL) 36000-114000 UNITS PO CPEP
ORAL_CAPSULE | ORAL | 11 refills | Status: DC
Start: 2023-02-06 — End: 2024-02-20

## 2023-02-06 MED ORDER — OMEPRAZOLE 40 MG PO CPDR
40.0000 mg | DELAYED_RELEASE_CAPSULE | Freq: Every day | ORAL | 3 refills | Status: DC
Start: 2023-02-06 — End: 2024-02-20

## 2023-03-14 ENCOUNTER — Ambulatory Visit (INDEPENDENT_AMBULATORY_CARE_PROVIDER_SITE_OTHER): Payer: No Typology Code available for payment source

## 2023-03-14 DIAGNOSIS — Z1231 Encounter for screening mammogram for malignant neoplasm of breast: Secondary | ICD-10-CM

## 2023-03-14 DIAGNOSIS — Z780 Asymptomatic menopausal state: Secondary | ICD-10-CM | POA: Diagnosis not present

## 2023-03-14 DIAGNOSIS — Z0000 Encounter for general adult medical examination without abnormal findings: Secondary | ICD-10-CM | POA: Diagnosis not present

## 2023-03-14 NOTE — Progress Notes (Signed)
Subjective:   Sophia Schmidt is a 72 y.o. female who presents for Medicare Annual (Subsequent) preventive examination.  Visit Complete: Virtual  I connected with  [name] on 03/14/23 by a audio enabled telemedicine application and verified that I am speaking with the correct person using two identifiers.  Patient Location: Home  Provider Location: Office/Clinic  I discussed the limitations of evaluation and management by telemedicine. The patient expressed understanding and agreed to proceed.  Because this visit was a virtual/telehealth visit, some criteria may be missing or patient reported. Any vitals not documented were not able to be obtained and vitals that have been documented are patient reported.   Cardiac Risk Factors include: advanced age (>56men, >46 women)     Objective:    There were no vitals filed for this visit. There is no height or weight on file to calculate BMI.     03/14/2023    1:10 PM 12/26/2021    7:19 AM 08/01/2021    4:20 PM 12/13/2016    9:32 AM 02/05/2015    8:38 AM  Advanced Directives  Does Patient Have a Medical Advance Directive? No Yes No Yes No  Type of Special educational needs teacher of [place];Living will  Healthcare Power of [place];Living will   Copy of Healthcare Power of Attorney in Chart?    No - copy requested   Would patient like information on creating a medical advance directive? No - Patient declined  No - Patient declined  Yes - Educational materials given    Current Medications (verified) Outpatient Encounter Medications as of 03/14/2023  Medication Sig   albuterol (VENTOLIN HFA) 108 (90 Base) MCG/ACT inhaler Inhale 2 puffs into the lungs every 4 (four) hours as needed for wheezing or shortness of breath (cough).   dicyclomine (BENTYL) 10 MG capsule Take 1 capsule (10 mg total) by mouth 3 (three) times daily before meals.   lipase/protease/amylase ([name]) 36000 UNITS CPEP capsule Take 2 capsules (72,000 Units  total) by mouth 3 (three) times daily with meals. May also take 1 capsule (36,000 Units total) as needed (with snacks).   omeprazole (PRILOSEC) 40 MG capsule Take 1 capsule (40 mg total) by mouth daily.   Bacillus Coagulans-Inulin (ALIGN PREBIOTIC-PROBIOTIC PO) Take by mouth. (Patient not taking: Reported on 03/14/2023)   ferrous sulfate 325 (65 FE) MG tablet Take 1 tablet by mouth daily. (Patient not taking: Reported on 03/14/2023)   Probiotic Product (ALIGN) 4 MG CAPS Take 1 capsule once daily for 30 days. (Patient not taking: Reported on 03/14/2023)   No facility-administered encounter medications on file as of 03/14/2023.    Allergies (verified) Penicillins   History: Past Medical History:  Diagnosis Date   Anemia    GERD (gastroesophageal reflux disease)    Past Surgical History:  Procedure Laterality Date   ABDOMINAL HYSTERECTOMY  1998   partial   BREAST CYST ASPIRATION Left 10/17/2014   FNA done by Dr. Evette Cristal   BREAST EXCISIONAL BIOPSY Left 1998   x 2   BREAST SURGERY Left 1998   removal of 2 fibrocystic masses   COLONOSCOPY  2011   COLONOSCOPY WITH PROPOFOL N/A 12/13/2016   Procedure: COLONOSCOPY WITH PROPOFOL;  Surgeon: Wyline Mood, MD;  Location: [facility] ENDOSCOPY;  Service: Endoscopy;  Laterality: N/A;   COLONOSCOPY WITH PROPOFOL N/A 12/26/2021   Procedure: COLONOSCOPY WITH PROPOFOL;  Surgeon: Wyline Mood, MD;  Location: [facility] ENDOSCOPY;  Service: Gastroenterology;  Laterality: N/A;   ESOPHAGOGASTRODUODENOSCOPY (EGD) WITH PROPOFOL N/A  12/13/2016   Procedure: ESOPHAGOGASTRODUODENOSCOPY (EGD) WITH PROPOFOL;  Surgeon: Wyline Mood, MD;  Location: [facility] ENDOSCOPY;  Service: Endoscopy;  Laterality: N/A;   TONSILLECTOMY  1996   Family History  Problem Relation Age of Onset   Pancreatic cancer Mother        pancreatic   Alzheimer's disease Father    Lung cancer Sister        lung   Breast cancer Sister        5   Lung cancer Brother        lung   Colon cancer Neg Hx     Social History   Socioeconomic History   Marital status: Divorced    Spouse name: Not on file   Number of children: Not on file   Years of education: Not on file   Highest education level: Not on file  Occupational History   Not on file  Tobacco Use   Smoking status: Never   Smokeless tobacco: Never  Vaping Use   Vaping status: Never Used  Substance and Sexual Activity   Alcohol use: No    Alcohol/week: 0.0 standard drinks of alcohol   Drug use: No   Sexual activity: Yes  Other Topics Concern   Not on file  Social History Narrative   Not on file   Social Determinants of Health   Financial Resource Strain: Low Risk  (03/14/2023)   Overall Financial Resource Strain (CARDIA)    Difficulty of Paying Living Expenses: Not hard at all  Food Insecurity: No Food Insecurity (03/14/2023)   Hunger Vital Sign    Worried About Running Out of Food in the Last Year: Never true    Ran Out of Food in the Last Year: Never true  Transportation Needs: No Transportation Needs (03/14/2023)   PRAPARE - Administrator, Civil Service (Medical): No    Lack of Transportation (Non-Medical): No  Physical Activity: Sufficiently Active (03/14/2023)   Exercise Vital Sign    Days of Exercise per Week: 5 days    Minutes of Exercise per Session: 90 min  Stress: No Stress Concern Present (03/14/2023)   [employer] of Occupational Health - Occupational Stress Questionnaire    Feeling of Stress : Not at all  Social Connections: Moderately Isolated (03/14/2023)   Social Connection and Isolation Panel [NHANES]    Frequency of Communication with Friends and Family: More than three times a week    Frequency of Social Gatherings with Friends and Family: Once a week    Attends Religious Services: Never    Database administrator or Organizations: No    Attends Engineer, structural: Never    Marital Status: Married    Tobacco Counseling Counseling given: Not Answered   Clinical  Intake:  Pre-visit preparation completed: Yes  Pain : No/denies pain     Nutritional Status: BMI 25 -29 Overweight Nutritional Risks: None Diabetes: No  How often do you need to have someone help you when you read instructions, pamphlets, or other written materials from your doctor or pharmacy?: 1 - Never  Interpreter Needed?: No  Information entered by :: Kennedy Bucker, LPN   Activities of Daily Living    03/14/2023    1:16 PM 03/14/2023    1:13 PM  In your present state of health, do you have any difficulty performing the following activities:  Hearing? 0 0  Vision? 0 0  Difficulty concentrating or making decisions? 0 0  Walking or climbing stairs?  0 0  Dressing or bathing? 0 0  Doing errands, shopping? 0 0  Preparing Food and eating ? N N  Using the Toilet? N N  In the past six months, have you accidently leaked urine? N N  Do you have problems with loss of bowel control? N N  Managing your Medications? N N  Managing your Finances? N N  Housekeeping or managing your Housekeeping? N N    Patient Care Team: Smitty Cords, DO as PCP - General (Family Medicine) Kieth Brightly, MD (General Surgery)  Indicate any recent Medical Services you may have received from other than [facility] providers in the past year (date may be approximate).     Assessment:   This is a routine wellness examination for [place].  Hearing/Vision screen Hearing Screening - Comments:: No aids Vision Screening - Comments:: Had cataracts removed, reading glasses- [facility] [address] Dr.Mincey did surgery   Goals Addressed             This Visit's Progress    DIET - EAT MORE FRUITS AND VEGETABLES         Depression Screen    03/14/2023    1:09 PM 10/31/2022    8:23 AM 12/04/2020    3:19 PM 10/23/2020    2:22 PM 01/07/2020    3:42 PM 12/02/2018    3:47 PM 08/18/2018    2:48 PM  PHQ 2/9 Scores  PHQ - 2 Score 0 0 2 6 0 0 0  PHQ- 9 Score 0  16 9       Fall  Risk    03/14/2023    1:13 PM 10/31/2022    8:23 AM 12/04/2020    3:19 PM 01/07/2020    3:42 PM 12/02/2018    3:47 PM  Fall Risk   Falls in the past year? 0 0 0 0 0  Number falls in past yr: 0  0 0   Injury with Fall? 0  0 0   Risk for fall due to : No Fall Risks      Follow up Falls prevention discussed;Falls evaluation completed  Falls evaluation completed Falls evaluation completed Falls evaluation completed    MEDICARE RISK AT HOME: Medicare Risk at Home Any stairs in or around the home?: No If so, are there any without handrails?: No Home free of loose throw rugs in walkways, pet beds, electrical cords, etc?: Yes Adequate lighting in your home to reduce risk of falls?: Yes Life alert?: No Use of a cane, walker or w/c?: No Grab bars in the bathroom?: No Shower chair or bench in shower?: Yes Elevated toilet seat or a handicapped toilet?: No  TIMED UP AND GO:  Was the test performed?  No    Cognitive Function:        03/14/2023    1:16 PM  6CIT Screen  What Year? 0 points  What month? 0 points  What time? 0 points  Count back from 20 0 points  Months in reverse 0 points  Repeat phrase 0 points  Total Score 0 points    Immunizations Immunization History  Administered Date(s) Administered   Fluad Quad(high Dose 65+) 03/12/2019, 03/29/2020, 03/14/2021   Influenza Inj Mdck Quad Pf 03/12/2019, 03/29/2020   Influenza,inj,Quad PF,6+ Mos 04/01/2016   Influenza,inj,quad, With Preservative 05/17/2016   Influenza-Unspecified 07/10/2019, 03/26/2020   PFIZER Comirnaty(Gray Top)Covid-19 Tri-Sucrose Vaccine 07/10/2019, 07/27/2019, 02/24/2020   PFIZER(Purple Top)SARS-COV-2 Vaccination 07/10/2019, 07/27/2019, 02/24/2020, 10/20/2020   PNEUMOCOCCAL CONJUGATE-20 06/25/2021  Pneumococcal Conjugate-13 08/18/2018   Pneumococcal-Unspecified 05/17/2016   Rsv, Bivalent, Protein Subunit Rsvpref,pf Verdis Frederickson) 03/23/2022   Zoster Recombinant(Shingrix) 07/20/2021, 09/21/2021    TDAP  status: Due, Education has been provided regarding the importance of this vaccine. Advised may receive this vaccine at local pharmacy or Health Dept. Aware to provide a copy of the vaccination record if obtained from local pharmacy or Health Dept. Verbalized acceptance and understanding.  Flu Vaccine status: Due, Education has been provided regarding the importance of this vaccine. Advised may receive this vaccine at local pharmacy or Health Dept. Aware to provide a copy of the vaccination record if obtained from local pharmacy or Health Dept. Verbalized acceptance and understanding.  Pneumococcal vaccine status: Up to date  Covid-19 vaccine status: Completed vaccines  Qualifies for Shingles Vaccine? Yes   Zostavax completed No   Shingrix Completed?: Yes  Screening Tests Health Maintenance  Topic Date Due   DTaP/Tdap/Td (1 - Tdap) Never done   DEXA SCAN  Never done   INFLUENZA VACCINE  01/16/2023   COVID-19 Vaccine (8 - 2023-24 season) 02/16/2023   Medicare Annual Wellness (AWV)  03/13/2024   MAMMOGRAM  04/30/2024   Colonoscopy  12/27/2026   Pneumonia Vaccine 56+ Years old  Completed   Hepatitis C Screening  Completed   Zoster Vaccines- Shingrix  Completed   HPV VACCINES  Aged Out    Health Maintenance  Health Maintenance Due  Topic Date Due   DTaP/Tdap/Td (1 - Tdap) Never done   DEXA SCAN  Never done   INFLUENZA VACCINE  01/16/2023   COVID-19 Vaccine (8 - 2023-24 season) 02/16/2023    Colorectal cancer screening: Type of screening: Colonoscopy. Completed 12/26/21. Repeat every 5 years  Mammogram status: Completed 04/30/22. Repeat every year  Bone Density status: Ordered 03/14/23. Pt provided with contact info and advised to call to schedule appt.  Lung Cancer Screening: (Low Dose CT Chest recommended if Age 24-80 years, 20 pack-year currently smoking OR have quit w/in 15years.) does not qualify.     Additional Screening:  Hepatitis C Screening: does qualify; Completed  04/07/17  Vision Screening: Recommended annual ophthalmology exams for early detection of glaucoma and other disorders of the eye. Is the patient up to date with their annual eye exam?  Yes  Who is the provider or what is the name of the office in which the patient attends annual eye exams? [facility] If pt is not established with a provider, would they like to be referred to a provider to establish care? No .   Dental Screening: Recommended annual dental exams for proper oral hygiene    Community Resource Referral / Chronic Care Management: CRR required this visit?  No   CCM required this visit?  No     Plan:     I have personally reviewed and noted the following in the patient's chart:   Medical and social history Use of alcohol, tobacco or illicit drugs  Current medications and supplements including opioid prescriptions. Patient is not currently taking opioid prescriptions. Functional ability and status Nutritional status Physical activity Advanced directives List of other physicians Hospitalizations, surgeries, and ER visits in previous 12 months Vitals Screenings to include cognitive, depression, and falls Referrals and appointments  In addition, I have reviewed and discussed with patient certain preventive protocols, quality metrics, and best practice recommendations. A written personalized care plan for preventive services as well as general preventive health recommendations were provided to patient.     Hal Hope, LPN  03/14/2023   After Visit Summary: (MyChart) Due to this being a telephonic visit, the after visit summary with patients personalized plan was offered to patient via MyChart   Nurse Notes: none

## 2023-03-14 NOTE — Patient Instructions (Addendum)
Sophia Schmidt , Thank you for taking time to come for your Medicare Wellness Visit. I appreciate your ongoing commitment to your health goals. Please review the following plan we discussed and let me know if I can assist you in the future.   Referrals/Orders/Follow-Ups/Clinician Recommendations: none  This is a list of the screening recommended for you and due dates:  Health Maintenance  Topic Date Due   DTaP/Tdap/Td vaccine (1 - Tdap) Never done   DEXA scan (bone density measurement)  Never done   Flu Shot  01/16/2023   COVID-19 Vaccine (8 - 2023-24 season) 02/16/2023   Medicare Annual Wellness Visit  03/13/2024   Mammogram  04/30/2024   Colon Cancer Screening  12/27/2026   Pneumonia Vaccine  Completed   Hepatitis C Screening  Completed   Zoster (Shingles) Vaccine  Completed   HPV Vaccine  Aged Out    Advanced directives: (ACP Link)Information on Advanced Care Planning can be found at [facility] Advance Health Care Directives Advance Health Care Directives ([email or web address]   Next Medicare Annual Wellness Visit scheduled for next year: Yes   03/18/24 @ 2:20 pm by video

## 2023-05-02 ENCOUNTER — Ambulatory Visit
Admission: RE | Admit: 2023-05-02 | Discharge: 2023-05-02 | Disposition: A | Discharge status: Home / Self Care | Payer: No Typology Code available for payment source | Source: Ambulatory Visit | Attending: Family Medicine | Admitting: Family Medicine

## 2023-05-02 DIAGNOSIS — Z1231 Encounter for screening mammogram for malignant neoplasm of breast: Secondary | ICD-10-CM | POA: Diagnosis present

## 2023-06-19 ENCOUNTER — Other Ambulatory Visit: Payer: Self-pay | Admitting: Physician Assistant

## 2023-06-19 ENCOUNTER — Ambulatory Visit
Admission: RE | Admit: 2023-06-19 | Discharge: 2023-06-19 | Disposition: A | Discharge status: Home / Self Care | Payer: No Typology Code available for payment source | Source: Ambulatory Visit | Attending: Family Medicine | Admitting: Family Medicine

## 2023-06-19 DIAGNOSIS — Z780 Asymptomatic menopausal state: Secondary | ICD-10-CM | POA: Diagnosis not present

## 2023-06-19 DIAGNOSIS — M85852 Other specified disorders of bone density and structure, left thigh: Secondary | ICD-10-CM | POA: Diagnosis not present

## 2023-06-19 DIAGNOSIS — R1084 Generalized abdominal pain: Secondary | ICD-10-CM

## 2023-06-20 ENCOUNTER — Ambulatory Visit: Payer: Self-pay | Admitting: *Deleted

## 2023-06-20 NOTE — Telephone Encounter (Signed)
  Chief Complaint: nasal congestion , cough requesting antibiotics Symptoms: nasal congestion runny nose  green/ yellow mucus. Cough . Taking cough med from last year and not as effective as she needs.  Frequency: this week Pertinent Negatives: Patient denies chest pain no difficulty breathing no fever Disposition: [] ED /[] Urgent Care (no appt availability in office) / [] Appointment(In office/virtual)/ []  [place] [facility]/ [] Home Care/ [] Refused Recommended Disposition /[] [place] [facility]/ [x]  Follow-up with PCP Additional Notes:   No available appt until Monday . Patient requesting if she can get medication today due to last year sx became pneumonia and was admitted to ICU. Patient requesting My chart VV. Non available. Requesting a call back please advise.        Summary: Antibiotic requested   Pt has a stuffy nose, when blowing nose she notices green and yellow stuff coming out. Pt states symptoms started this week. Pt requesting an antibiotic to be sent in.          Reason for Disposition  [1] Nasal discharge AND [2] present > 10 days    Less than 10 days  Answer Assessment - Initial Assessment Questions 1. LOCATION: Where does it hurt?      Nasal congestion runny nose 2. ONSET: When did the sinus pain start?  (e.g., hours, days)      This week 3. SEVERITY: How bad is the pain?   (Scale 1-10; mild, moderate or severe)   - MILD (1-3): doesn't interfere with normal activities    - MODERATE (4-7): interferes with normal activities (e.g., work or school) or awakens from sleep   - SEVERE (8-10): excruciating pain and patient unable to do any normal activities        Worsening sx starting to cough 4. RECURRENT SYMPTOM: Have you ever had sinus problems before? If Yes, ask: When was the last time? and What happened that time?      Yes  5. NASAL CONGESTION: Is the nose blocked? If Yes, ask: Can you open it or must you breathe through your mouth?     Na   6. NASAL DISCHARGE: Do you have discharge from your nose? If so ask, What color?     Yes green/ yellow  7. FEVER: Do you have a fever? If Yes, ask: What is it, how was it measured, and when did it start?      Na  8. OTHER SYMPTOMS: Do you have any other symptoms? (e.g., sore throat, cough, earache, difficulty breathing)     Cough , nasal congestion . Hx pneumonia 9. PREGNANCY: Is there any chance you are pregnant? When was your last menstrual period?     na  Protocols used: Sinus Pain or Congestion-A-AH

## 2023-07-07 ENCOUNTER — Ambulatory Visit: Payer: 59 | Admitting: Family Medicine

## 2023-07-07 ENCOUNTER — Encounter: Payer: Self-pay | Admitting: Family Medicine

## 2023-07-07 VITALS — BP 152/90 | HR 112 | Ht 63.0 in | Wt 156.0 lb

## 2023-07-07 DIAGNOSIS — J209 Acute bronchitis, unspecified: Secondary | ICD-10-CM | POA: Diagnosis not present

## 2023-07-07 DIAGNOSIS — J9801 Acute bronchospasm: Secondary | ICD-10-CM

## 2023-07-07 MED ORDER — AZITHROMYCIN 250 MG PO TABS
ORAL_TABLET | ORAL | 0 refills | Status: DC
Start: 2023-07-07 — End: 2024-01-19

## 2023-07-07 MED ORDER — PREDNISONE 20 MG PO TABS
ORAL_TABLET | ORAL | 0 refills | Status: DC
Start: 2023-07-07 — End: 2024-02-20

## 2023-07-07 MED ORDER — GUAIFENESIN-CODEINE 100-10 MG/5ML PO SOLN: 5.0000 mL | Freq: Three times a day (TID) | ORAL | 0 refills | Status: DC | PRN

## 2023-07-07 NOTE — Patient Instructions (Addendum)
Thank you for coming to the office today.  Start Azithromycin Z pak (antibiotic) 2 tabs day 1, then 1 tab x 4 days, complete entire course even if improved  Prednisone taper  X-ray if interested walk in   Please schedule a Follow-up Appointment to: Return if symptoms worsen or fail to improve.  If you have any other questions or concerns, please feel free to call the office or send a message through MyChart. You may also schedule an earlier appointment if necessary.  Additionally, you may be receiving a survey about your experience at our office within a few days to 1 week by e-mail or mail. We value your feedback.  Saralyn Pilar, DO [facility], [place]

## 2023-07-07 NOTE — Progress Notes (Signed)
Subjective:    Patient ID: Sophia Schmidt, female    DOB: April 06, 1951, 73 y.o.   MRN: [id number]  Sophia Schmidt is a 73 y.o. female presenting on 07/07/2023 for Cough  Patient presents for a same day appointment.   HPI  Discussed the use of AI scribe software for clinical note transcription with the patient, who gave verbal consent to proceed.  History of Present Illness    Cough  The patient, with a history of chronic lung disease bronchiectasis presents with a new onset of coughing spells that started yesterday. The cough is described as persistent and uncontrollable once it starts. The patient has performed a home COVID swab test which returned negative and reports no recent sick contacts.  The patient has a history of lung issues with Bronchiectasis in 2022 history of pneumonia and has been under the care of pulmonologists at [facility], but this care is currently paused since she has improved clinically.  She has a rescue inhaler which provides some relief for the current symptoms.  In the past, the patient has been treated with various medications including antibiotics and steroids for similar lung issues. The patient's cough is described as tight and wheezing, suggestive of an asthma reaction. The patient does not report any fluid rattling in the airways, but describes the airways as sounding tight.   The patient has been on [name] for a long time and recently read that cough could be a side effect of this medication.         03/14/2023    1:09 PM 10/31/2022    8:23 AM 12/04/2020    3:19 PM  Depression screen PHQ 2/9  Decreased Interest 0 0 0  Down, Depressed, Hopeless 0 0 2  PHQ - 2 Score 0 0 2  Altered sleeping 0  2  Tired, decreased energy 0  2  Change in appetite 0  3  Feeling bad or failure about yourself  0  3  Trouble concentrating 0  0  Moving slowly or fidgety/restless 0  3  Suicidal thoughts 0  1  PHQ-9 Score 0  16  Difficult doing work/chores Not difficult at  all  Extremely dIfficult       10/31/2022    8:23 AM 12/04/2020    3:20 PM  GAD 7 : Generalized Anxiety Score  Nervous, Anxious, on Edge 0 0  Control/stop worrying 0 3  Worry too much - different things 0 3  Trouble relaxing 0 2  Restless 0 0  Easily annoyed or irritable 0 2  Afraid - awful might happen 0 0  Total GAD 7 Score 0 10  Anxiety Difficulty  Somewhat difficult    Social History   Tobacco Use   Smoking status: Never   Smokeless tobacco: Never  Vaping Use   Vaping status: Never Used  Substance Use Topics   Alcohol use: No    Alcohol/week: 0.0 standard drinks of alcohol   Drug use: No    Review of Systems Per HPI unless specifically indicated above     Objective:    BP (!) 152/90 (BP Location: Left Arm, Cuff Size: Normal)   Pulse (!) 112   Ht 5\' 3"  (1.6 m)   Wt 156 lb (70.8 kg)   SpO2 99%   BMI 27.63 kg/m   Wt Readings from Last 3 Encounters:  07/07/23 156 lb (70.8 kg)  02/06/23 157 lb (71.2 kg)  12/18/22 157 lb (71.2 kg)    Physical Exam  Vitals and nursing note reviewed.  Constitutional:      General: She is not in acute distress.    Appearance: Normal appearance. She is well-developed. She is not diaphoretic.     Comments: Well-appearing, comfortable, cooperative  HENT:     Head: Normocephalic and atraumatic.  Eyes:     General:        Right eye: No discharge.        Left eye: No discharge.     Conjunctiva/sclera: Conjunctivae normal.  Cardiovascular:     Rate and Rhythm: Normal rate.  Pulmonary:     Effort: Pulmonary effort is normal. No respiratory distress.     Breath sounds: No stridor. Wheezing present. No rhonchi or rales.     Comments: coughing Skin:    General: Skin is warm and dry.     Findings: No erythema or rash.  Neurological:     Mental Status: She is alert and oriented to person, place, and time.  Psychiatric:        Mood and Affect: Mood normal.        Behavior: Behavior normal.        Thought Content: Thought content  normal.     Comments: Well groomed, good eye contact, normal speech and thoughts     Results for orders placed or performed in visit on 12/18/22  Basic Metabolic Panel (BMET)   Collection Time: 12/18/22  2:04 PM  Result Value Ref Range   Glucose 80 70 - 99 mg/dL   BUN 7 (L) 8 - 27 mg/dL   Creatinine, Ser 9.56 0.57 - 1.00 mg/dL   eGFR 97 >38 VF/IEP/3.29   BUN/Creatinine Ratio 12 12 - 28   Sodium 142 134 - 144 mmol/L   Potassium 3.4 (L) 3.5 - 5.2 mmol/L   Chloride 105 96 - 106 mmol/L   CO2 22 20 - 29 mmol/L   Calcium 10.4 (H) 8.7 - 10.3 mg/dL  Calprotectin, Fecal   Collection Time: 12/24/22  8:00 AM   Specimen: Stool  Result Value Ref Range   Calprotectin, Fecal 118 0 - 120 ug/g  Clostridium Difficile by PCR   Collection Time: 12/24/22  8:00 AM   ST  Result Value Ref Range   Toxigenic C. Difficile by PCR Negative Negative  GI Profile, Stool, PCR   Collection Time: 12/24/22  8:00 AM  Result Value Ref Range   Campylobacter Not Detected Not Detected   C difficile toxin A/B Not Detected Not Detected   Plesiomonas shigelloides Not Detected Not Detected   Salmonella Not Detected Not Detected   Vibrio Not Detected Not Detected   Vibrio cholerae Not Detected Not Detected   Yersinia enterocolitica Not Detected Not Detected   Enteroaggregative E coli Not Detected Not Detected   Enteropathogenic E coli Not Detected Not Detected   Enterotoxigenic E coli Not Detected Not Detected   Shiga-toxin-producing E coli Not Detected Not Detected   E coli O157 Not applicable Not Detected   Shigella/Enteroinvasive E coli Not Detected Not Detected   Cryptosporidium Not Detected Not Detected   Cyclospora cayetanensis Not Detected Not Detected   Entamoeba histolytica Not Detected Not Detected   Giardia lamblia Not Detected Not Detected   Adenovirus F 40/41 Not Detected Not Detected   Astrovirus Not Detected Not Detected   Norovirus GI/GII Not Detected Not Detected   Rotavirus A Not Detected Not  Detected   Sapovirus Not Detected Not Detected  Pancreatic elastase, fecal   Collection Time: 12/24/22  8:00 AM  Result Value Ref Range   Pancreatic Elastase, Fecal 177 (L) >200 ug Elast./g      Assessment & Plan:   Problem List Items Addressed This Visit   None Visit Diagnoses       Acute bronchitis, unspecified organism    -  Primary   Relevant Medications   azithromycin (ZITHROMAX Z-PAK) 250 MG tablet   predniSONE (DELTASONE) 20 MG tablet   guaiFENesin-codeine (VIRTUSSIN A/C) 100-10 MG/5ML syrup   Other Relevant Orders   DG Chest 2 View     Bronchospasm       Relevant Medications   predniSONE (DELTASONE) 20 MG tablet        Acute Cough History of bronchiectasis New onset coughing spells starting yesterday. No sick contacts. Negative home COVID swab. Lung sounds suggest tight airways, indicative of an asthma reaction possibly triggered by a viral infection.  Patient on [name] for a long time, which has a low percentage side effect of cough, but onset is sudden and not likely related.  Start Azithromycin Z pak (antibiotic) 2 tabs day 1, then 1 tab x 4 days, complete entire course even if improved Start Prednisone taper for 7 days.  -Order Robitussin with codeine for symptomatic relief. -Consider chest x-ray if symptoms do not improve, order placed and patient can decide to proceed if necessary. -Follow-up as needed.     She may need to follow back up with [facility] if not improving in future or recurrence.    Orders Placed This Encounter  Procedures   DG Chest 2 View    Standing Status:   Future    Expiration Date:   07/06/2024    Reason for Exam (SYMPTOM  OR DIAGNOSIS REQUIRED):   acute bronchitis, history of bronchiectasis    Preferred imaging location?:   [facility]    Meds ordered this encounter  Medications   azithromycin (ZITHROMAX Z-PAK) 250 MG tablet    Sig: Take 2 tabs (500mg  total) on Day 1. Take 1 tab (250mg ) daily for next 4 days.     Dispense:  6 tablet    Refill:  0   predniSONE (DELTASONE) 20 MG tablet    Sig: Take daily with food. Start with 60mg  (3 pills) x 2 days, then reduce to 40mg  (2 pills) x 2 days, then 20mg  (1 pill) x 3 days    Dispense:  13 tablet    Refill:  0   guaiFENesin-codeine (VIRTUSSIN A/C) 100-10 MG/5ML syrup    Sig: Take 5 mLs by mouth 3 (three) times daily as needed for cough.    Dispense:  118 mL    Refill:  0    Follow up plan: Return if symptoms worsen or fail to improve.  Saralyn Pilar, DO [facility] 07/07/2023, 2:29 PM

## 2023-08-26 ENCOUNTER — Telehealth: Payer: Self-pay | Admitting: Physician Assistant

## 2023-08-26 NOTE — Telephone Encounter (Signed)
 The patient called and left a message requesting that [name] send a prescription for her stomach medication. I returned her call to confirm receipt of the message and informed her that I would need the name of her medication and the pharmacy where it should be sent. The patient mentioned that she is requesting a different medication, as her current medication, [name], costs $330. She asked for assistance with this issue.

## 2023-08-27 ENCOUNTER — Telehealth: Payer: Self-pay

## 2023-08-27 NOTE — Telephone Encounter (Signed)
 Spoke with patient- she is currently taking [name] 36,000 units and it is helping- she however has switched insurances to sole medicare and can not afford the copay of 300.00 -   Creo assistance form completed and faxed to [phone] with patients permission.  Sample bottle put up front for patient until we see if drug will be covered.

## 2023-08-27 NOTE — Telephone Encounter (Signed)
 Patient called office today-she is taking [name]- she has recently dropped her insurance and only has Medicare /medicaid and her cost of this medication is 300.00 a month- she is asking if there is another medication that you can prescribe that will be cheaper.

## 2023-09-11 ENCOUNTER — Telehealth: Payer: Self-pay

## 2023-09-11 NOTE — Telephone Encounter (Signed)
 Received confirmation from [name] Assistance support enrollment form that all information was received.

## 2024-01-11 ENCOUNTER — Other Ambulatory Visit: Payer: Self-pay | Admitting: Family Medicine

## 2024-01-11 DIAGNOSIS — J209 Acute bronchitis, unspecified: Secondary | ICD-10-CM

## 2024-01-12 ENCOUNTER — Other Ambulatory Visit: Payer: Self-pay | Admitting: Family Medicine

## 2024-01-12 DIAGNOSIS — J209 Acute bronchitis, unspecified: Secondary | ICD-10-CM

## 2024-01-12 NOTE — Telephone Encounter (Unsigned)
 Copied from CRM [id number]. Topic: Clinical - Medication Refill >> Jan 12, 2024  2:38 PM Ameerah G wrote: Medication: guaiFENesin -codeine  (VIRTUSSIN A/C) 100-10 MG/5ML syrup [[id number]]  Has the patient contacted their pharmacy? Yes (Agent: If no, request that the patient contact the pharmacy for the refill. If patient does not wish to contact the pharmacy document the reason why and proceed with request.) (Agent: If yes, when and what did the pharmacy advise?)  This is the patient's preferred pharmacy:  [facility] [address] ([address]), [place] - [address] [place]) [place] [address] Phone: [phone] Fax: [phone]  Is this the correct pharmacy for this prescription? Yes If no, delete pharmacy and type the correct one.   Has the prescription been filled recently? No  Is the patient out of the medication? Yes  Has the patient been seen for an appointment in the last year OR does the patient have an upcoming appointment? Yes  Can we respond through MyChart? Yes  Agent: Please be advised that Rx refills may take up to 3 business days. We ask that you follow-up with your pharmacy.

## 2024-01-13 NOTE — Telephone Encounter (Signed)
 Requested medications are due for refill today.  unsure  Requested medications are on the active medications list.  yes  Last refill. 07/07/2023 118 mL 0 rf  Future visit scheduled.   yes  Notes to clinic.  Medciation not assigned to a protocol.     Requested Prescriptions  Pending Prescriptions Disp Refills   guaiFENesin -codeine  (VIRTUSSIN A/C) 100-10 MG/5ML syrup 118 mL 0    Sig: Take 5 mLs by mouth 3 (three) times daily as needed for cough.     Off-Protocol Failed - 01/13/2024  4:12 PM      Failed - Medication not assigned to a protocol, review manually.      Failed - Valid encounter within last 12 months    Recent Outpatient Visits   None

## 2024-01-13 NOTE — Telephone Encounter (Signed)
 Requested medication (s) are due for refill today: Yes  Requested medication (s) are on the active medication list: Yes  Last refill:  07/07/23  Future visit scheduled: Yes  Notes to clinic:  Not delegated    Requested Prescriptions  Pending Prescriptions Disp Refills   guaiFENesin -codeine  100-10 MG/5ML syrup [Pharmacy Med Name: guaiFENesin -Codeine  100-10 MG/5ML Oral Solution] 13 mL 0    Sig: TAKE 5 ML BY MOUTH  THREE TIMES DAILY AS NEEDED FOR COUGH     Off-Protocol Failed - 01/13/2024 10:49 AM      Failed - Medication not assigned to a protocol, review manually.      Failed - Valid encounter within last 12 months    Recent Outpatient Visits   None

## 2024-01-15 ENCOUNTER — Ambulatory Visit: Payer: Self-pay

## 2024-01-15 NOTE — Telephone Encounter (Signed)
 Appointment scheduled to see Dr. Edman on Monday

## 2024-01-15 NOTE — Telephone Encounter (Signed)
 Patient just wanted to see if her  Dr Marsa Officer would call in this prescription for her with this information She states that she is not having any difficulty breathing, fevers, chest pain, or any symptoms other than a dry lingering cough for about a week. She is advised that he may need to see her for an assessment prior and she said that would be fine if he requires that for someone to contact her for that  Last filled: 07/07/2023  FYI Only or Action Required?: Action required by provider: update on patient condition and request for medication.  Patient was last seen in primary care on 07/07/2023 by Officer Marsa PARAS, DO.  Called Nurse Triage reporting Cough.  Symptoms began about a week ago.  Interventions attempted: OTC medications: cough drops and Prescription medications: patient did use the prescription previously given to her for cough but it had expired.  Symptoms are: unchanged.  Triage Disposition: See Physician Within 24 Hours  Patient/caregiver understands and will follow disposition?:                  Copied from CRM #[id number]. Topic: Clinical - Medication Refill >> Jan 12, 2024  2:38 PM Ameerah G wrote: Medication: guaiFENesin -codeine  (VIRTUSSIN A/C) 100-10 MG/5ML syrup [[id number]]  Has the patient contacted their pharmacy? Yes (Agent: If no, request that the patient contact the pharmacy for the refill. If patient does not wish to contact the pharmacy document the reason why and proceed with request.) (Agent: If yes, when and what did the pharmacy advise?)  This is the patient's preferred pharmacy:  [facility] [address]), [place] - [address] [place]) [place] [address] Phone: [phone] Fax: [phone]  Is this the correct pharmacy for this prescription? Yes If no, delete pharmacy and type the correct one.   Has the prescription been filled recently? No  Is the patient  out of the medication? Yes  Has the patient been seen for an appointment in the last year OR does the patient have an upcoming appointment? Yes  Can we respond through MyChart? Yes  Agent: Please be advised that Rx refills may take up to 3 business days. We ask that you follow-up with your pharmacy. Reason for Disposition  [1] Continuous (nonstop) coughing interferes with work or school AND [2] no improvement using cough treatment per Care Advice  Answer Assessment - Initial Assessment Questions Patient was taking this medication for cough but it has expired  She has been using over the counter cough drops but it's not going away Patient is advised that if anything gets worse to go to the Emergency Room She verbalized understanding       1. ONSET: When did the cough begin?      About a week ago 2. SEVERITY: How bad is the cough today?      persistent 3. SPUTUM: Describe the color of your sputum (e.g., none, dry cough; clear, white, yellow, green)     N/A 4. HEMOPTYSIS: Are you coughing up any blood? If Yes, ask: How much? (e.g., flecks, streaks, tablespoons, etc.)     N/A 5. DIFFICULTY BREATHING: Are you having difficulty breathing? If Yes, ask: How bad is it? (e.g., mild, moderate, severe)      No 6. FEVER: Do you have a fever? If Yes, ask: What is your temperature, how was it measured, and when did it start?     No 7. CARDIAC HISTORY: Do you have any  history of heart disease? (e.g., heart attack, congestive heart failure)      No 8. LUNG HISTORY: Do you have any history of lung disease?  (e.g., pulmonary embolus, asthma, emphysema)     No 9. PE RISK FACTORS: Do you have a history of blood clots? (or: recent major surgery, recent prolonged travel, bedridden)     No 10. OTHER SYMPTOMS: Do you have any other symptoms? (e.g., runny nose, wheezing, chest pain)       No  Protocols used: Cough - Acute Non-Productive-A-AH

## 2024-01-19 ENCOUNTER — Ambulatory Visit (INDEPENDENT_AMBULATORY_CARE_PROVIDER_SITE_OTHER): Admitting: Internal Medicine

## 2024-01-19 ENCOUNTER — Encounter: Payer: Self-pay | Admitting: Family Medicine

## 2024-01-19 ENCOUNTER — Encounter: Payer: Self-pay | Admitting: Internal Medicine

## 2024-01-19 VITALS — BP 130/84 | HR 85 | Ht 63.0 in | Wt 154.6 lb

## 2024-01-19 DIAGNOSIS — R0982 Postnasal drip: Secondary | ICD-10-CM

## 2024-01-19 DIAGNOSIS — J479 Bronchiectasis, uncomplicated: Secondary | ICD-10-CM

## 2024-01-19 DIAGNOSIS — K219 Gastro-esophageal reflux disease without esophagitis: Secondary | ICD-10-CM

## 2024-01-19 DIAGNOSIS — R1013 Epigastric pain: Secondary | ICD-10-CM

## 2024-01-19 DIAGNOSIS — R051 Acute cough: Secondary | ICD-10-CM

## 2024-01-19 DIAGNOSIS — K8681 Exocrine pancreatic insufficiency: Secondary | ICD-10-CM

## 2024-01-19 MED ORDER — AZITHROMYCIN 250 MG PO TABS: ORAL_TABLET | ORAL | 0 refills | Status: DC

## 2024-01-19 MED ORDER — FEXOFENADINE HCL 180 MG PO TABS: 180.0000 mg | ORAL_TABLET | Freq: Every day | ORAL | 0 refills | Status: DC

## 2024-01-19 MED ORDER — ALBUTEROL SULFATE HFA 108 (90 BASE) MCG/ACT IN AERS: 2.0000 | INHALATION_SPRAY | RESPIRATORY_TRACT | 5 refills | Status: AC | PRN

## 2024-01-19 MED ORDER — BENZONATATE 100 MG PO CAPS: 100.0000 mg | ORAL_CAPSULE | Freq: Three times a day (TID) | ORAL | 0 refills | Status: DC | PRN

## 2024-01-19 NOTE — Progress Notes (Unsigned)
 Subjective:    Patient ID: Sophia Schmidt, female    DOB: 1950-11-21, 73 y.o.   MRN: [id number]  HPI  Discussed the use of AI scribe software for clinical note transcription with the patient, who gave verbal consent to proceed.  Sophia Schmidt is a 73 year old female with bronchiectasis who presents with a persistent cough.  She has experienced a cough for about a week without any associated sputum production. No runny nose, nasal congestion, sinus pain, shortness of breath, fever, chills, or body aches. She is concerned about the cough potentially affecting her lungs, especially since she had pneumonia in 2022. Her history of bronchiectasis, with the last flare occurring in January, adds to her concern.  She uses an albuterol  inhaler as needed but notes that it did not help with the current cough. She denies any postnasal drip but mentions that her voice becomes hoarse or itchy at times, which she attributes to the cough.  She does not smoke and dislikes being around smoke.       Review of Systems   Past Medical History:  Diagnosis Date   Anemia    GERD (gastroesophageal reflux disease)     Current Outpatient Medications  Medication Sig Dispense Refill   albuterol  (VENTOLIN  HFA) 108 (90 Base) MCG/ACT inhaler Inhale 2 puffs into the lungs every 4 (four) hours as needed for wheezing or shortness of breath (cough). 1 each 5   lipase/protease/amylase ([name] ) 36000 UNITS CPEP capsule Take 2 capsules (72,000 Units total) by mouth 3 (three) times daily with meals. May also take 1 capsule (36,000 Units total) as needed (with snacks). 240 capsule 11   omeprazole  (PRILOSEC) 40 MG capsule Take 1 capsule (40 mg total) by mouth daily. 90 capsule 3   azithromycin  (ZITHROMAX  Z-PAK) 250 MG tablet Take 2 tabs (500mg  total) on Day 1. Take 1 tab (250mg ) daily for next 4 days. (Patient not taking: Reported on 01/19/2024) 6 tablet 0   Bacillus Coagulans-Inulin (ALIGN PREBIOTIC-PROBIOTIC PO) Take by  mouth. (Patient not taking: Reported on 07/07/2023)     dicyclomine  (BENTYL ) 10 MG capsule Take 1 capsule (10 mg total) by mouth 3 (three) times daily before meals. 90 capsule 5   ferrous sulfate 325 (65 FE) MG tablet Take 1 tablet by mouth daily. (Patient not taking: Reported on 07/07/2023)     guaiFENesin -codeine  (VIRTUSSIN A/C) 100-10 MG/5ML syrup Take 5 mLs by mouth 3 (three) times daily as needed for cough. 118 mL 0   predniSONE  (DELTASONE ) 20 MG tablet Take daily with food. Start with 60mg  (3 pills) x 2 days, then reduce to 40mg  (2 pills) x 2 days, then 20mg  (1 pill) x 3 days (Patient not taking: Reported on 01/19/2024) 13 tablet 0   Probiotic Product (ALIGN) 4 MG CAPS Take 1 capsule once daily for 30 days. (Patient not taking: Reported on 01/19/2024)     No current facility-administered medications for this visit.    Allergies  Allergen Reactions   Penicillins Rash    Pt states  breaks her out    Family History  Problem Relation Age of Onset   Pancreatic cancer Mother        pancreatic   Alzheimer's disease Father    Breast cancer Sister    Lung cancer Sister        lung   Breast cancer Sister        62   Lung cancer Brother        lung  Colon cancer Neg Hx     Social History   Socioeconomic History   Marital status: Divorced    Spouse name: Not on file   Number of children: Not on file   Years of education: Not on file   Highest education level: Not on file  Occupational History   Not on file  Tobacco Use   Smoking status: Never   Smokeless tobacco: Never  Vaping Use   Vaping status: Never Used  Substance and Sexual Activity   Alcohol use: No    Alcohol/week: 0.0 standard drinks of alcohol   Drug use: No   Sexual activity: Yes  Other Topics Concern   Not on file  Social History Narrative   Not on file   Social Drivers of Health   Financial Resource Strain: Low Risk  (03/14/2023)   Overall Financial Resource Strain (CARDIA)    Difficulty of Paying Living  Expenses: Not hard at all  Food Insecurity: No Food Insecurity (03/14/2023)   Hunger Vital Sign    Worried About Running Out of Food in the Last Year: Never true    Ran Out of Food in the Last Year: Never true  Transportation Needs: No Transportation Needs (03/14/2023)   PRAPARE - Administrator, Civil Service (Medical): No    Lack of Transportation (Non-Medical): No  Physical Activity: Sufficiently Active (03/14/2023)   Exercise Vital Sign    Days of Exercise per Week: 5 days    Minutes of Exercise per Session: 90 min  Stress: No Stress Concern Present (03/14/2023)   [employer] of Occupational Health - Occupational Stress Questionnaire    Feeling of Stress : Not at all  Social Connections: Moderately Isolated (03/14/2023)   Social Connection and Isolation Panel    Frequency of Communication with Friends and Family: More than three times a week    Frequency of Social Gatherings with Friends and Family: Once a week    Attends Religious Services: Never    Database administrator or Organizations: No    Attends Banker Meetings: Never    Marital Status: Married  Catering manager Violence: Not At Risk (03/14/2023)   Humiliation, Afraid, Rape, and Kick questionnaire    Fear of Current or Ex-Partner: No    Emotionally Abused: No    Physically Abused: No    Sexually Abused: No     Constitutional: Denies fever, malaise, fatigue, headache or abrupt weight changes.  HEENT: Patient reports scratchy throat.  Denies eye pain, eye redness, ear pain, ringing in the ears, wax buildup, runny nose, nasal congestion, bloody nose, or sore throat. Respiratory: Patient reports cough.  Denies difficulty breathing, shortness of breath, or sputum production.   Cardiovascular: Denies chest pain, chest tightness, palpitations or swelling in the hands or feet.  Skin: Denies redness, rashes, lesions or ulcercations.  Neurological: Denies dizziness, difficulty with memory, difficulty  with speech or problems with balance and coordination.    No other specific complaints in a complete review of systems (except as listed in HPI above).      Objective:   Physical Exam BP 130/84 (BP Location: Right Arm, Patient Position: Sitting, Cuff Size: Normal)   Pulse 85   Ht 5' 3 (1.6 m)   Wt 154 lb 9.6 oz (70.1 kg)   SpO2 100%   BMI 27.39 kg/m   Wt Readings from Last 3 Encounters:  07/07/23 156 lb (70.8 kg)  02/06/23 157 lb (71.2 kg)  12/18/22 157 lb (  71.2 kg)    General: Appears her stated age, overweight, in NAD. Skin: Warm, dry and intact. No rashes noted. HEENT: Head: normal shape and size, no sinus pressure noted; Eyes: sclera white, no icterus, conjunctiva pink, PERRLA and EOMs intact; Throat/Mouth: Teeth present, mucosa pink and moist, + PND, no exudate, lesions or ulcerations noted.  Neck: No adenopathy noted. Cardiovascular: Normal rate and rhythm.  Pulmonary/Chest: Normal effort and positive vesicular breath sounds. No respiratory distress. No wheezes, rales or ronchi noted.  Musculoskeletal: No difficulty with gait.  Neurological: Alert and oriented.   BMET    Component Value Date/Time   NA 142 12/18/2022 1404   K 3.4 (L) 12/18/2022 1404   CL 105 12/18/2022 1404   CO2 22 12/18/2022 1404   GLUCOSE 80 12/18/2022 1404   GLUCOSE 87 10/31/2022 0852   BUN 7 (L) 12/18/2022 1404   CREATININE 0.58 12/18/2022 1404   CREATININE 0.57 (L) 10/31/2022 0852   CALCIUM 10.4 (H) 12/18/2022 1404   GFRNONAA >60 08/01/2021 1633   GFRNONAA 99 01/03/2020 0936   GFRAA 115 01/03/2020 0936    Lipid Panel     Component Value Date/Time   CHOL 201 (H) 03/09/2021 0848   TRIG 79 03/09/2021 0848   HDL 58 03/09/2021 0848   CHOLHDL 3.5 03/09/2021 0848   VLDL 17 04/01/2016 1505   LDLCALC 125 (H) 03/09/2021 0848    CBC    Component Value Date/Time   WBC 4.9 10/31/2022 0852   RBC 4.61 10/31/2022 0852   HGB 12.3 10/31/2022 0852   HCT 38.1 10/31/2022 0852   PLT 298  10/31/2022 0852   MCV 82.6 10/31/2022 0852   MCH 26.7 (L) 10/31/2022 0852   MCHC 32.3 10/31/2022 0852   RDW 13.9 10/31/2022 0852   LYMPHSABS 1,642 10/31/2022 0852   MONOABS 348 04/01/2016 1505   EOSABS 211 10/31/2022 0852   BASOSABS 59 10/31/2022 0852    Hgb A1C Lab Results  Component Value Date   HGBA1C 5.4 03/09/2021            Assessment & Plan:   Assessment and Plan    Acute cough due to postnasal drainage Cough likely due to postnasal drip, not bronchiectasis. Expected improvement with antihistamine. - Prescribed Allegra  180 mg once daily for two weeks. - Prescribed Tessalon  Perles 100 mg every eight hours for cough. - Rx for azithromycin  250 mg x 5 days if symptoms worsen by the end of the week - Refilled albuterol  inhaler.  Bronchiectasis No current flare-up; cough attributed to postnasal drainage. Lungs clear, no dyspnea. No bronchiectasis symptoms.

## 2024-01-20 ENCOUNTER — Encounter: Payer: Self-pay | Admitting: Internal Medicine

## 2024-01-20 NOTE — Patient Instructions (Signed)

## 2024-01-26 NOTE — Addendum Note (Signed)
 Addended by: EDMAN MARSA PARAS on: 01/26/2024 05:52 PM   Modules accepted: Orders

## 2024-02-03 DIAGNOSIS — K8681 Exocrine pancreatic insufficiency: Secondary | ICD-10-CM | POA: Diagnosis not present

## 2024-02-03 DIAGNOSIS — R1013 Epigastric pain: Secondary | ICD-10-CM | POA: Diagnosis not present

## 2024-02-03 DIAGNOSIS — K449 Diaphragmatic hernia without obstruction or gangrene: Secondary | ICD-10-CM | POA: Diagnosis not present

## 2024-02-03 NOTE — Progress Notes (Signed)
 Corinn JONELLE Brooklyn, MD [facility], [facility] [address]  [place], [place] [address]  Main: [phone] Fax:  [phone] Pager: [phone]   Gastroenterology Consultation  Referring Provider:     Edman Blunt * Primary Care Physician:  [facility] Primary Gastroenterologist:  Dr. Corinn JONELLE Brooklyn Reason for Consultation: Chronic GERD, abdominal bloating, EPI        HPI:   Sophia Schmidt is a 73 y.o. female referred by [facility]  for consultation & management of chronic GERD was previously on PPI, abdominal bloating, EPI.  Patient had pancreatic fecal elastase levels at 177 which were checked in 2024 secondary to abdominal bloating and diarrhea.  She has been on pancreatic enzyme replacement therapy since then.  Her concern is upper abdominal discomfort associated with abdominal bloating which is worse postprandial.  She has family history of pancreatic cancer and is worried about the same with her and want to make sure she does not need any further evaluation.  She had CT abdomen and pelvis with contrast in 2024 with normal-appearing pancreas.  She never smoked in her life.  She does report intermittent heartburn, is not on any acid suppressive medication.  She does have history of 4 cm hiatal hernia based on upper endoscopy in 2018. She does acknowledge to not eating healthy, does consume sugary drinks, carbonated beverages  -H. pylori breath test was Negative 10/2022.   -Abdominal pelvic CT 12/27/2022 showed no acute abnormality.  Moderate hiatal hernia.  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   -Lab 10/31/2022 showed normal CMP, CBC, and lipase.  H. pylori breath test negative.   -RUQ ultrasound 11/05/2022 showed no acute abnormality.  Incidental increased echogenicity of the right kidney.  Normal gallbladder, no gallstones.   -EGD 11/2016 showed 4 cm hiatal hernia, otherwise normal.  She was  on PPI for GERD.   -Last Colonoscopy 12/2021 showed diverticulosis, otherwise normal.  No polyps.  Repeat in 10 years.  No past medical history on file.  No past surgical history on file.   Current Outpatient Medications:  .  albuterol  MDI, PROVENTIL , VENTOLIN , PROAIR , HFA 90 mcg/actuation inhaler, Inhale 2 inhalations into the lungs, Disp: , Rfl:  .  fluticasone furoate-vilanteroL (BREO ELLIPTA) 100-25 mcg/dose DsDv inhaler, Inhale 1 Puff into the lungs once daily, Disp: , Rfl:  .  lipase-protease-amylase ([name] ) 36,000-114,000-180,000 unit DR capsule, Take by mouth, Disp: , Rfl:  .  naproxen  (NAPROSYN ) 500 MG tablet, Take 1 tablet (500 mg total) by mouth 2 (two) times daily with meals (Patient not taking: Reported on 02/03/2024), Disp: 60 tablet, Rfl: 0   No family history on file.   Social History   Tobacco Use  . Smoking status: Never  . Smokeless tobacco: Never  Vaping Use  . Vaping status: Never Used  Substance Use Topics  . Alcohol use: Not Currently    Allergies as of 02/03/2024 - Reviewed 02/03/2024  Allergen Reaction Noted  . Penicillins Rash 10/20/2020    Review of Systems:    All systems reviewed and negative except where noted in HPI.   Physical Exam:  BP (!) 148/91 (BP Location: Right upper arm, Patient Position: Sitting, BP Cuff Size: Adult)   Pulse 79   Ht 167.6 cm (5' 6)   Wt 70.3 kg (155 lb)   BMI 25.02 kg/m  No LMP recorded. Patient is postmenopausal.  General:   Alert,  Well-developed, well-nourished, pleasant and cooperative in NAD Eyes:  Sclera  clear, no icterus.   Conjunctiva pink. Lungs:  Respirations even and unlabored.  Clear throughout to auscultation.   No wheezes, crackles, or rhonchi. No acute distress. Heart:  Regular rate and rhythm; no murmurs, clicks, rubs, or gallops. Abdomen:  Normal bowel sounds. Soft, non-tender and non-distended without masses, hepatosplenomegaly or hernias noted.  No guarding or rebound tenderness.   Rectal:  Not performed Extremities:  No clubbing or edema.  No cyanosis. Neurologic:  Alert and oriented x3;  grossly normal neurologically. Skin:  Intact without significant lesions or rashes. No jaundice. Psych:  Alert and cooperative. Normal mood and affect.  Imaging Studies: Reviewed  Assessment and Plan:   Sophia Schmidt is a 73 y.o. 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 Pabone to evaluate for hiatal hernia repair  Exocrine pancreatic insufficiency Reiterated on low-fat, low-carb and high-protein, lean meat only Okay to increase [name]  36k to 3 capsules with first bite of each meal and 1-2 with snack   Follow up in 6 months   Corinn JONELLE Brooklyn, MD

## 2024-02-20 ENCOUNTER — Encounter: Payer: Self-pay | Admitting: Gastroenterology

## 2024-02-26 NOTE — Anesthesia Preprocedure Evaluation (Signed)
 Anesthesia Evaluation  Patient identified by MRN, date of birth, ID band Patient awake    Reviewed: Allergy & Precautions, H&P , NPO status , Patient's Chart, lab work & pertinent test results  Airway Mallampati: I  TM Distance: >3 FB Neck ROM: Full    Dental no notable dental hx. (+) Lower Dentures, Partial Lower   Pulmonary neg pulmonary ROS, shortness of breath, pneumonia   Pulmonary exam normal breath sounds clear to auscultation       Cardiovascular negative cardio ROS Normal cardiovascular exam Rhythm:Regular Rate:Normal     Neuro/Psych  Neuromuscular disease negative neurological ROS  negative psych ROS   GI/Hepatic negative GI ROS, Neg liver ROS, hiatal hernia,GERD  ,,  Endo/Other  negative endocrine ROS    Renal/GU negative Renal ROS  negative genitourinary   Musculoskeletal negative musculoskeletal ROS (+)    Abdominal   Peds negative pediatric ROS (+)  Hematology negative hematology ROS (+) Blood dyscrasia, anemia   Anesthesia Other Findings   GERD (gastroesophageal reflux disease) Anemia Pneumonia  Dyspnea History of hiatal hernia  Exocrine pancreatic insufficiency    Reproductive/Obstetrics negative OB ROS                              Anesthesia Physical Anesthesia Plan  ASA: 2  Anesthesia Plan: General   Post-op Pain Management:    Induction: Intravenous  PONV Risk Score and Plan:   Airway Management Planned: Natural Airway and Nasal Cannula  Additional Equipment:   Intra-op Plan:   Post-operative Plan:   Informed Consent: I have reviewed the patients History and Physical, chart, labs and discussed the procedure including the risks, benefits and alternatives for the proposed anesthesia with the patient or authorized representative who has indicated his/her understanding and acceptance.     Dental Advisory Given  Plan Discussed with:  Anesthesiologist, CRNA and Surgeon  Anesthesia Plan Comments: (Patient consented for risks of anesthesia including but not limited to:  - adverse reactions to medications - risk of airway placement if required - damage to eyes, teeth, lips or other oral mucosa - nerve damage due to positioning  - sore throat or hoarseness - Damage to heart, brain, nerves, lungs, other parts of body or loss of life  Patient voiced understanding and assent.)        Anesthesia Quick Evaluation

## 2024-02-27 ENCOUNTER — Ambulatory Visit
Admission: RE | Admit: 2024-02-27 | Discharge: 2024-02-27 | Disposition: A | Discharge status: Home / Self Care | Attending: Gastroenterology | Admitting: Gastroenterology

## 2024-02-27 ENCOUNTER — Encounter
Admission: RE | Disposition: A | Discharge status: Home / Self Care | Payer: Self-pay | Source: Home / Self Care | Attending: Gastroenterology

## 2024-02-27 ENCOUNTER — Other Ambulatory Visit: Payer: Self-pay

## 2024-02-27 ENCOUNTER — Ambulatory Visit: Payer: Self-pay | Admitting: Anesthesiology

## 2024-02-27 ENCOUNTER — Encounter: Payer: Self-pay | Admitting: Gastroenterology

## 2024-02-27 DIAGNOSIS — R140 Abdominal distension (gaseous): Secondary | ICD-10-CM | POA: Diagnosis not present

## 2024-02-27 DIAGNOSIS — K3189 Other diseases of stomach and duodenum: Secondary | ICD-10-CM | POA: Diagnosis not present

## 2024-02-27 DIAGNOSIS — K449 Diaphragmatic hernia without obstruction or gangrene: Secondary | ICD-10-CM | POA: Diagnosis not present

## 2024-02-27 HISTORY — PX: UPPER GI ENDOSCOPY: SHX6162

## 2024-02-27 HISTORY — DX: Exocrine pancreatic insufficiency: K86.81

## 2024-02-27 HISTORY — DX: Dyspnea, unspecified: R06.00

## 2024-02-27 HISTORY — DX: Pneumonia, unspecified organism: J18.9

## 2024-02-27 HISTORY — DX: Personal history of other diseases of the digestive system: Z87.19

## 2024-02-27 SURGERY — ENDOSCOPY, UPPER GI TRACT
Anesthesia: General

## 2024-02-27 MED ORDER — LACTATED RINGERS IV SOLN: INTRAVENOUS | Status: DC

## 2024-02-27 MED ORDER — LIDOCAINE HCL (CARDIAC) PF 100 MG/5ML IV SOSY
PREFILLED_SYRINGE | INTRAVENOUS | Status: DC | PRN
  Administered 2024-02-27: 100 mg via INTRAVENOUS

## 2024-02-27 MED ORDER — PROPOFOL 10 MG/ML IV BOLUS
INTRAVENOUS | Status: DC | PRN
Start: 2024-02-27 — End: 2024-02-27
  Administered 2024-02-27 (×2): 50 mg via INTRAVENOUS
  Administered 2024-02-27: 30 mg via INTRAVENOUS

## 2024-02-27 MED ORDER — PROPOFOL 10 MG/ML IV BOLUS
INTRAVENOUS | Status: AC
  Filled 2024-02-27: qty 20

## 2024-02-27 SURGICAL SUPPLY — 5 items
FORCEPS BIOP RAD 4 LRG CAP 4 (CUTTING FORCEPS) IMPLANT
GAUZE SPONGE 4X4 12PLY STRL (GAUZE/BANDAGES/DRESSINGS) IMPLANT
GOWN CVR UNV OPN BCK APRN NK (MISCELLANEOUS) IMPLANT
KIT DEFENDO VALVE AND CONN (KITS) IMPLANT
KIT PRC NS LF DISP ENDO (KITS) IMPLANT

## 2024-02-27 NOTE — Op Note (Addendum)
 [facility] Patient Name: Sophia Schmidt Procedure Date: 02/27/2024 7:25 AM MRN: [id number] Account #: [id number] Date of Birth: 1950-12-07 Admit Type: Outpatient Age: 73 Room: [facility] OR ROOM 01 Gender: Female Note Status: Supervisor Override Instrument Name: Endoscope [id number] Procedure:             Upper GI endoscopy Indications:           Dyspepsia, Follow-up of hiatal hernia Providers:             Corinn Jess Brooklyn MD, MD Referring MD:          Marsa DOROTHA Officer (Referring MD) Medicines:             General Anesthesia Complications:         No immediate complications. Estimated blood loss: None. Procedure:             Pre-Anesthesia Assessment:                        - Prior to the procedure, a History and Physical was                         performed, and patient medications and allergies were                         reviewed. The patient is competent. The risks and                         benefits of the procedure and the sedation options and                         risks were discussed with the patient. All questions                         were answered and informed consent was obtained.                         Patient identification and proposed procedure were                         verified by the physician, the nurse, the                         anesthesiologist, the anesthetist and the technician                         in the pre-procedure area in the procedure room in the                         endoscopy suite. Mental Status Examination: alert and                         oriented. Airway Examination: normal oropharyngeal                         airway and neck mobility. Respiratory Examination:                         clear to auscultation. CV Examination: normal.  Prophylactic Antibiotics: The patient does not require                         prophylactic antibiotics. Prior Anticoagulants: The                          patient has taken no anticoagulant or antiplatelet                         agents. ASA Grade Assessment: II - A patient with mild                         systemic disease. After reviewing the risks and                         benefits, the patient was deemed in satisfactory                         condition to undergo the procedure. The anesthesia                         plan was to use general anesthesia. Immediately prior                         to administration of medications, the patient was                         re-assessed for adequacy to receive sedatives. The                         heart rate, respiratory rate, oxygen saturations,                         blood pressure, adequacy of pulmonary ventilation, and                         response to care were monitored throughout the                         procedure. The physical status of the patient was                         re-assessed after the procedure.                        After obtaining informed consent, the endoscope was                         passed under direct vision. Throughout the procedure,                         the patient's blood pressure, pulse, and oxygen                         saturations were monitored continuously. The Endoscope                         was introduced through the mouth, and advanced to the  second part of duodenum. The upper GI endoscopy was                         accomplished without difficulty. The patient tolerated                         the procedure well. Findings:      The duodenal bulb and second portion of the duodenum were normal.      Diffuse mildly erythematous mucosa without bleeding was found in the       gastric antrum. Biopsies were taken with a cold forceps for Helicobacter       pylori testing.      The gastric body and incisura were normal. Biopsies were taken with a       cold forceps for Helicobacter pylori testing.      A 2 cm  hiatal hernia was present.      The gastroesophageal junction and examined esophagus were normal. Impression:            - Normal duodenal bulb and second portion of the                         duodenum.                        - Erythematous mucosa in the antrum. Biopsied.                        - Normal gastric body and incisura. Biopsied.                        - 2 cm hiatal hernia.                        - Normal gastroesophageal junction and esophagus. Recommendation:        - Discharge patient to home (with escort).                        - Resume previous diet today.                        - Continue present medications.                        - Await pathology results.                        - Refer to a surgeon as previously scheduled. Procedure Code(s):     --- Professional ---                        [phone], Esophagogastroduodenoscopy, flexible,                         transoral; with biopsy, single or multiple Diagnosis Code(s):     --- Professional ---                        K31.89, Other diseases of stomach and duodenum                        K44.9, Diaphragmatic hernia without  obstruction or                         gangrene                        R10.13, Epigastric pain CPT copyright 2022 American Medical Association. All rights reserved. The codes documented in this report are preliminary and upon coder review may  be revised to meet current compliance requirements. Dr. Corinn Brooklyn Corinn Jess Brooklyn MD, MD 02/27/2024 8:11:00 AM This report has been signed electronically. Number of Addenda: 0 Note Initiated On: 02/27/2024 7:25 AM Estimated Blood Loss:  Estimated blood loss: none.      [facility]

## 2024-02-27 NOTE — Transfer of Care (Signed)
 Immediate Anesthesia Transfer of Care Note  Patient: Sophia Schmidt  Procedure(s) Performed: ENDOSCOPY, UPPER GI TRACT  Patient Location: PACU  Anesthesia Type: General  Level of Consciousness: awake, alert  and patient cooperative  Airway and Oxygen Therapy: Patient Spontanous Breathing and Patient connected to supplemental oxygen  Post-op Assessment: Post-op Vital signs reviewed, Patient's Cardiovascular Status Stable, Respiratory Function Stable, Patent Airway and No signs of Nausea or vomiting  Post-op Vital Signs: Reviewed and stable  Complications: No notable events documented.

## 2024-02-27 NOTE — Anesthesia Postprocedure Evaluation (Signed)
 Anesthesia Post Note  Patient: Sophia Schmidt  Procedure(s) Performed: ENDOSCOPY, UPPER GI TRACT with polypectomy  Patient location during evaluation: PACU Anesthesia Type: General Level of consciousness: awake and alert Pain management: pain level controlled Vital Signs Assessment: post-procedure vital signs reviewed and stable Respiratory status: spontaneous breathing, nonlabored ventilation, respiratory function stable and patient connected to nasal cannula oxygen Cardiovascular status: blood pressure returned to baseline and stable Postop Assessment: no apparent nausea or vomiting Anesthetic complications: no   No notable events documented.   Last Vitals:  Vitals:   02/27/24 0815 02/27/24 0830  BP: (!) 148/86 (!) 140/85  Pulse: 79 67  Resp: 18 (!) 24  Temp:  (!) 36.4 C  SpO2: 100% 98%    Last Pain:  Vitals:   02/27/24 0830  TempSrc:   PainSc: 0-No pain                 Banjamin Stovall C Younis Mathey
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
## 2024-03-01 LAB — SURGICAL PATHOLOGY

## 2024-03-03 ENCOUNTER — Ambulatory Visit: Admitting: Surgery

## 2024-03-03 ENCOUNTER — Ambulatory Visit: Payer: Self-pay | Admitting: Gastroenterology

## 2024-03-03 VITALS — BP 142/95 | HR 74 | Temp 98.5°F | Ht 63.0 in | Wt 152.0 lb

## 2024-03-03 DIAGNOSIS — K449 Diaphragmatic hernia without obstruction or gangrene: Secondary | ICD-10-CM

## 2024-03-03 NOTE — Patient Instructions (Signed)
 We have scheduled you for a CT Scan of your Abdomen and Pelvis with contrast. This has been scheduled at [facility] on 03/09/24 . Please arrive there by 12:45pm for a 1pm appointment . If you need to reschedule your Scan, you may do so by calling [phone]. Please let us  know if you reschedule your scan as we have to get authorization from your insurance for this.    Your barium swallow will be done right after your CT scan    Hiatal Hernia  A hiatal hernia occurs when part of the stomach slides above the muscle that separates the abdomen from the chest (diaphragm). A person can be born with a hiatal hernia (congenital), or it may develop over time. In almost all cases of hiatal hernia, only the top part of the stomach pushes through the diaphragm. Many people have a hiatal hernia with no symptoms. The larger the hernia, the more likely it is that you will have symptoms. In some cases, a hiatal hernia allows stomach acid to flow back into the tube that carries food from your mouth to your stomach (esophagus). This may cause heartburn symptoms. The development of heartburn symptoms may mean that you have a condition called gastroesophageal reflux disease (GERD). What are the causes? This condition is caused by a weakness in the opening (hiatus) where the esophagus passes through the diaphragm to attach to the upper part of the stomach. A person may be born with a weakness in the hiatus, or a weakness can develop over time. What increases the risk? This condition is more likely to develop in: Older people. Age is a major risk factor for a hiatal hernia, especially if you are over the age of 4. Pregnant women. People who are overweight. People who have frequent constipation. What are the signs or symptoms? Symptoms of this condition usually develop in the form of GERD symptoms. Symptoms include: Heartburn. Upset stomach (indigestion). Trouble swallowing. Coughing or  wheezing. Wheezing is making high-pitched whistling sounds when you breathe. Sore throat. Chest pain. Nausea and vomiting. How is this diagnosed? This condition may be diagnosed during testing for GERD. Tests that may be done include: X-rays of your stomach or chest. An upper gastrointestinal (GI) series. This is an X-ray exam of your GI tract that is taken after you swallow a chalky liquid that shows up clearly on the X-ray. Endoscopy. This is a procedure to look into your stomach using a thin, flexible tube that has a tiny camera and light on the end of it. How is this treated? This condition may be treated by: Dietary and lifestyle changes to help reduce GERD symptoms. Medicines. These may include: Over-the-counter antacids. Medicines that make your stomach empty more quickly. Medicines that block the production of stomach acid (H2 blockers). Stronger medicines to reduce stomach acid (proton pump inhibitors). Surgery to repair the hernia, if other treatments are not helping. If you have no symptoms, you may not need treatment. Follow these instructions at home: Lifestyle and activity Do not use any products that contain nicotine or tobacco. These products include cigarettes, chewing tobacco, and vaping devices, such as e-cigarettes. If you need help quitting, ask your health care provider. Try to achieve and maintain a healthy body weight. Avoid putting pressure on your abdomen. Anything that puts pressure on your abdomen increases the amount of acid that may be pushed up into your esophagus. Avoid bending over, especially after eating. Raise the head of your bed by putting  blocks under the legs. This keeps your head and esophagus higher than your stomach. Do not wear tight clothing around your chest or stomach. Try not to strain when having a bowel movement, when urinating, or when lifting heavy objects. Eating and drinking Avoid foods that can worsen GERD symptoms. These may  include: Fatty foods, like fried foods. Citrus fruits, like oranges or lemon. Other foods and drinks that contain acid, like orange juice or tomatoes. Spicy food. Chocolate. Eat frequent small meals instead of three large meals a day. This helps prevent your stomach from getting too full. Eat slowly. Do not lie down right after eating. Do not eat 1-2 hours before bed. Do not drink beverages with caffeine. These include cola, coffee, cocoa, and tea. Do not drink alcohol. General instructions Take over-the-counter and prescription medicines only as told by your health care provider. Keep all follow-up visits. Your health care provider will want to check that any new prescribed medicines are helping your symptoms. Contact a health care provider if: Your symptoms are not controlled with medicines or lifestyle changes. You are having trouble swallowing. You have coughing or wheezing that will not go away. Your pain is getting worse. Your pain spreads to your arms, neck, jaw, teeth, or back. You feel nauseous or you vomit. Get help right away if: You have shortness of breath. You vomit blood. You have bright red blood in your stools. You have black, tarry stools. These symptoms may be an emergency. Get help right away. Call 911. Do not wait to see if the symptoms will go away. Do not drive yourself to the hospital. Summary A hiatal hernia occurs when part of the stomach slides above the muscle that separates the abdomen from the chest. A person may be born with a weakness in the hiatus, or a weakness can develop over time. Symptoms of a hiatal hernia may include heartburn, trouble swallowing, or sore throat. Management of a hiatal hernia includes eating frequent small meals instead of three large meals a day. Get help right away if you vomit blood, have bright red blood in your stools, or have black, tarry stools. This information is not intended to replace advice given to you by your  health care provider. Make sure you discuss any questions you have with your health care provider. Document Revised: 07/31/2021 Document Reviewed: 07/31/2021 Elsevier Patient Education  2024 [employer].
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
## 2024-03-09 ENCOUNTER — Ambulatory Visit
Admission: RE | Admit: 2024-03-09 | Discharge: 2024-03-09 | Disposition: A | Discharge status: Home / Self Care | Source: Ambulatory Visit | Attending: Surgery | Admitting: Surgery

## 2024-03-09 ENCOUNTER — Ambulatory Visit
Admission: RE | Admit: 2024-03-09 | Discharge: 2024-03-09 | Disposition: A | Discharge status: Home / Self Care | Source: Ambulatory Visit | Attending: Family Medicine | Admitting: Family Medicine

## 2024-03-09 ENCOUNTER — Ambulatory Visit: Payer: Self-pay | Admitting: Family Medicine

## 2024-03-09 DIAGNOSIS — J209 Acute bronchitis, unspecified: Secondary | ICD-10-CM | POA: Diagnosis not present

## 2024-03-09 DIAGNOSIS — K449 Diaphragmatic hernia without obstruction or gangrene: Secondary | ICD-10-CM | POA: Diagnosis not present

## 2024-03-09 DIAGNOSIS — K224 Dyskinesia of esophagus: Secondary | ICD-10-CM | POA: Diagnosis not present

## 2024-03-09 DIAGNOSIS — R1310 Dysphagia, unspecified: Secondary | ICD-10-CM | POA: Diagnosis not present

## 2024-03-09 DIAGNOSIS — K219 Gastro-esophageal reflux disease without esophagitis: Secondary | ICD-10-CM | POA: Diagnosis not present

## 2024-03-09 DIAGNOSIS — N281 Cyst of kidney, acquired: Secondary | ICD-10-CM | POA: Diagnosis not present

## 2024-03-09 DIAGNOSIS — R1110 Vomiting, unspecified: Secondary | ICD-10-CM | POA: Diagnosis not present

## 2024-03-09 MED ORDER — IOHEXOL 300 MG/ML  SOLN
80.0000 mL | Freq: Once | INTRAMUSCULAR | Status: AC | PRN
  Administered 2024-03-09: 80 mL via INTRAVENOUS

## 2024-03-17 ENCOUNTER — Ambulatory Visit: Admitting: Surgery

## 2024-03-17 ENCOUNTER — Telehealth: Payer: Self-pay | Admitting: Surgery

## 2024-03-17 ENCOUNTER — Encounter: Payer: Self-pay | Admitting: Surgery

## 2024-03-17 VITALS — BP 155/90 | HR 67 | Ht 63.0 in | Wt 153.0 lb

## 2024-03-17 DIAGNOSIS — K449 Diaphragmatic hernia without obstruction or gangrene: Secondary | ICD-10-CM | POA: Diagnosis not present
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
## 2024-03-17 NOTE — Telephone Encounter (Signed)
 Patient has been advised of Pre-Admission date/time, and Surgery date at [facility].  Surgery Date: 04/01/24 Preadmission Testing Date: 03/25/24 (phone 1p-4p)  Patient informed of the scheduling process and surgery information given at time of office visit.  Patient has been made aware to call [phone], between 1-3:00pm the day before surgery, to find out what time to arrive for surgery.

## 2024-03-17 NOTE — Patient Instructions (Addendum)
 We have spoken today about repairing your Hiatal Hernia. Your surgery will be scheduled at [facility] with Dr. Jordis.  Plan to be in the hospital for 1-2 days if the minimally invasive surgery is completed without having to make a bigger incision. If the bigger incision is made, you will most likely need to be in the hospital 4-6 days. You will be on a soft diet and need to recover for 2 weeks following your surgery prior to doing any of your normal activities. At the 2 week mark, we will see you in the office and if you are doing ok we will advance your diet and activity level as you tolerate.  Please see your Blue (Pre-care) Sheet for more information regarding your surgery.  You will need to arrange to be out of work for approximately 1-2 weeks and then you may return with a lifting restriction for 4 more weeks. If you have FMLA or Disability paperwork that needs to be filled out, please have your company fax your paperwork to [phone] or you may drop this by either office. This paperwork will be filled out within 3 days after your surgery has been completed.  Please call our office with any questions or concerns that you have regarding your surgery and recovery.  Laparoscopic Nissen Fundoplication Laparoscopic Nissen fundoplication is surgery to relieve heartburn and other problems caused by gastric fluids flowing up into your esophagus. The esophagus is the tube that carries food and liquid from your throat to your stomach. Normally, the muscle that sits between your stomach and your esophagus (lower esophageal sphincter or LES) keeps stomach fluids in your stomach. In some people, the LES does not work properly, and stomach fluids flow up into the esophagus. This can happen when part of the stomach bulges through the LES (hiatal hernia). The backward flow of stomach fluids can cause a type of severe and long-standing heartburn that is called gastroesophageal reflux disease (GERD). You may  need this surgery if other treatments for GERD have not helped. In a laparoscopic Nissen fundoplication, the upper part of your stomach is wrapped around the lower part of your esophagus to strengthen the LES and prevent reflux. If you have a hiatal hernia, it will also be repaired with this surgery. The procedure is done through several small incisions in your abdomen. It is performed using a thin, telescopic instrument (laparoscope) and other instruments that can pass through the scope or through other small incisions. Tell a health care provider about: Any allergies you have. All medicines you are taking, including vitamins, herbs, eye drops, creams, and over-the-counter medicines. Any problems you or family members have had with anesthetic medicines. Any blood disorders you have. Any surgeries you have had. Any medical conditions you have. What are the risks? Generally, this is a safe procedure. However, problems may occur, including: Difficulty swallowing (dysphagia). Bloating. Nausea or vomiting. Damage to the lung, causing a collapsed lung. Infection or bleeding. What happens before the procedure? Ask your health care provider about: Changing or stopping your regular medicines. This is especially important if you are taking diabetes medicines or blood thinners. Taking medicines such as aspirin and ibuprofen . These medicines can thin your blood. Do not take these medicines before your procedure if your health care provider asks you not to. Follow your health care provider's instructions about eating or drinking restrictions. Plan to have someone take you home after the procedure. What happens during the procedure? An IV tube will be inserted into  one of your veins. It will be used to give you fluids and medicines during the procedure. You will be given a medicine that makes you fall asleep (general anesthetic). Your abdomen will be cleaned with a germ-killing solution  (antiseptic). The surgeon will make a small incision in your abdomen and insert a tube through the incision. Your abdomen will be filled with a gas. This helps the surgeon to see your organs more easily and it makes more space to work. The surgeon will insert the laparoscope through the incision. The scope has a camera that will send pictures to a monitor in the operating room. The surgeon will make several other small incisions in your abdomen to insert the other instruments that are needed during the procedure. Another instrument (dilator) will be passed through your mouth and down your esophagus into the upper part of your stomach. The dilator will prevent your LES from being closed too tightly during surgery. The surgeon will pass the top portion of your stomach behind the lower part of your esophagus and wrap it all the way around. This will be stitched into place. If you have a hiatal hernia, it will be repaired during this procedure. All instruments will be removed, and your incisions will be closed under your skin with stitches (sutures). Skin adhesive strips may also be used. A bandage (dressing) will be placed on your skin over the incisions. The procedure may vary among health care providers and hospitals. What happens after the procedure? You will be moved to a recovery area. Your blood pressure, heart rate, breathing rate, and blood oxygen level will be monitored often until the medicines you were given have worn off. You will be given pain medicine as needed. Your IV tube will be kept in until you are able to drink fluids. This information is not intended to replace advice given to you by your health care provider. Make sure you discuss any questions you have with your health care provider. Document Released: 06/24/2014 Document Revised: 11/09/2015 Document Reviewed: 02/02/2014 Elsevier Interactive Patient Education  2017 [employer].   Laparoscopic Nissen Fundoplication, Care  After Refer to this sheet in the next few weeks. These instructions provide you with information about caring for yourself after your procedure. Your health care provider may also give you more specific instructions. Your treatment has been planned according to current medical practices, but problems sometimes occur. Call your health care provider if you have any problems or questions after your procedure. What can I expect after the procedure? After the procedure, it is common to have: Difficulty swallowing (dysphagia). Excess gas (bloating). Follow these instructions at home: Medicines  Take medicines only as directed by your health care provider. Do not drive or operate heavy machinery while taking pain medicine. Incision care  There are many different ways to close and cover an incision, including stitches (sutures), skin glue, and adhesive strips. Follow your health care provider's instructions about: Incision care. Bandage (dressing) changes and removal. Incision closure removal. Check your incision areas every day for signs of infection. Watch for: Redness, swelling, or pain. Fluid, blood, or pus. Do not take baths, swim, or use a hot tub until your health care provider approves. Take showers as directed by your health care provider. Eating and drinking  Follow your health care provider's instructions about eating. You may need to follow a very soft diet for 2 weeks, followed by a diet of more regular foods for 2 weeks, no breads. You should return  to your usual diet gradually. Drink enough fluid to keep your urine clear or pale yellow. Activity  Return to your normal activities as directed by your health care provider. Ask your health care provider what activities are safe for you. Avoid strenuous exercise. Do not lift anything that is heavier than 10 lb (4.5 kg). Ask your health care provider when you can: Return to sexual activity. Drive. Go back to work. Contact a health  care provider if: You have a fever. Your pain gets worse or is not helped by medicine. You have frequent nausea or vomiting. You have continued abdominal bloating. You have an ongoing (persistent) cough. You have redness, swelling, or pain in any incision areas. You have fluid, blood, or pus coming from any incisions. Get help right away if: You have trouble breathing. You are unable to swallow. You have persistent vomiting. You have blood in your vomit. You have severe abdominal pain. This information is not intended to replace advice given to you by your health care provider. Make sure you discuss any questions you have with your health care provider. Document Released: 01/25/2004 Document Revised: 11/09/2015 Document Reviewed: 02/02/2014 Elsevier Interactive Patient Education  2017 Else[employer] Inc.   Diet After Nissen Fundoplication Surgery This diet information is for patients who have recently had Nissen fundoplication surgery to correct reflux disease or to repair various types of hernias, such as hiatal hernia and intrathoracic stomach. This diet may also be used for other gastrointestinal surgeries, such as Heller myotomy and repair of achalasia. The diet will help control diarrhea, excess gas and swallowing problems, which may occur after this type of surgery. Keeping Your Stomach from Stretching Eat small, frequent meals (six to eight per day). This will help you consume the majority of the nutrients you need without causing your stomach to feel full or distended.  Drinking large amounts of fluids with meals can stretch your stomach. You may drink fluids between meals as often as you like, but limit fluids to 1/2 cup (4 fluid ounces) with meals and one cup (8 fluid ounces) with snacks.  Sit upright while eating and stay upright for 30 minutes after each meal. Gravity can help food move through your digestive tract. Do not lie down after eating. Sit upright for 2 hours after your last  meal or snack of the day.  Eat very slowly. Take your time when eating.  Take small bites and chew your food well to help aid in swallowing and digestion.  Avoid crusty breads and sticky, gummy foods, such as bananas, fresh doughy breads, rolls and doughnuts. These types of foods become sticky and difficult to swallow.  Toasted breads tend to be better tolerated.  Lastly, if you eat sweets, consume them at the end of your meal to avoid a group of symptoms referred to as "dumping syndrome". This describes the rapid emptying of foods from the stomach to the small intestine. Sweetened beverages, candy and desserts move more rapidly and dump quickly into the intestines. This can cause symptoms of nausea, weakness, cold sweats, cramps, diarrhea and dizzy spells.  Avoiding Gas Avoid drinking through a straw. Do not chew gum or tobacco. These actions cause you to swallow air, which produces excess gas in your stomach. Chew with your mouth closed.  Avoid any foods that cause stomach gas and distention. These foods include corn, dried beans, peas, lentils, onions, broccoli, cauliflower and any food from the cabbage family.  Avoid carbonated drinks, alcohol, citrus and tomato products.  When will I be able to eat a soft diet? After Nissen fundoplication surgery, your diet will be advanced slowly by your surgeon. Generally, you will be on a clear liquid diet for the first few meals. Then you will advance to the full liquid diet for a meal or two and eventually to a Nissen soft diet. Please be aware that each patient's tolerance to food is different. Your doctor will advance your diet depending on how well you progress after surgery. Clear Liquid Diet  The first diet after surgery is the clear liquid diet. It includes the following liquids: Apple juice  Cranberry juice  Grape juice  Chicken broth  Beef broth  Flavored gelatin (Jell-O)  Decaf tea and coffee  Caffeinated beverages are permitted based on  tolerance  Popsicles  [place] ice Carbonated drinks (sodas) are not allowed for the first six to eight weeks after surgery. After this time you can try them again in small amounts.  Full Liquid Diet The full liquid diet contains anything on the clear liquid diet, plus: Milk, soy, rice and almond (no chocolate)  Cream of wheat, cream of rice, grits  Strained creamed soups (no tomato or broccoli)  Vanilla and strawberry-flavored ice cream  Sherbet  Blended, custard styled or whipped yogurt (plain or vanilla only)  Vanilla and butterscotch pudding (no chocolate or coconut)  Nutritional drinks including Ensure, Boost, Carnation Instant Breakfast (no chocolate-flavored) Note: Dairy products, such as milk, ice cream and pudding, may cause diarrhea in some people just after surgery. You may need to avoid milk products. If so, substitute them with lactose-free beverages, such as soy, rice, Lactaid or almond milks.  Nissen Soft Diet Food Category Foods to Choose Foods to Avoid  Beverages Milk, such as, whole, 2%, 1%, non-fat, or skim, soy, rice, almond  Caffeinated and decaf tea and coffee  Powdered drink mixes (in moderation)  Non-citrus juices (apple, grape, cranberry or blends of these)  Fruit nectars  Nutritional drinks including Boost, Ensure, Carnation Instant Breakfast Chocolate milk, cocoa or other chocolate-flavored drinks  Carbonated drinks  Alcohol  Citrus juices like orange, grapefruit, lemon and lime  Breads Crackers (saltine, butter, soda, graham, Goldfish and Cheese Nips)   Untoasted bread, bagels, [place] and hard rolls, English muffins  Crackers with nuts, seeds, fresh or dried fruit, coconut, or highly seasoned, such as garlic or onion-flavored  Sweet rolls, coffee cake or doughnuts  Cereals Well cooked cereals, such as oatmeal (plain or flavored)  Cold cereal (Cornflakes, Rice Krispies, Cheerios, Special K plain, Rice Chex and puffed rice) Very coarse cereal,  such as bran, shredded wheat  Any cereal with fresh or dried fruit, coconut, seeds or nuts  Desserts Eat in moderation and do not eat desserts or sweets by themselves. Plain cakes, cookies and cream-filled pies  Vanilla and butterscotch pudding or custard  Ice cream, ice milk, frozen yogurt and sherbet  Gelatin made from allowed foods  Fruit ices and popsicles Desserts containing chocolate, coconut, nuts, seeds, fresh or dried fruit, peppermint or spearmint  Eggs  Poached, hard boiled or scrambled Fried eggs and highly seasoned eggs (deviled eggs)  Fats Eat in moderation. Butter and margarine  Mayonnaise and vegetable oils  Mildly seasoned cream sauces and gravies  Plain cream cheese  Sour cream Highly seasoned salad dressings, cream sauces and gravies  Bacon, bacon fat, ham fat, lard and salt pork  Fried foods  Nuts  Fruits Fruit juice  Any canned or cooked fruit except those listed in  the AVOID column ALL fresh fruits, such as citrus, apples, and pineapple  Canned pineapple  Dried fruits, such as raisins, berries  Fruits with seeds, such as berries, kiwi and figs  Meat, Fish, Poultry, and Dairy Products Meats may be ground, minced or chopped to ease swallowing and digestion  Tender, well cooked and moist cuts of beef, chicken, [place] and pork  Veal and lamb  Flaky, cooked fish  Canned tuna  Cottage and ricotta cheeses  Mild cheese, such as American, brick, mozzarella and baby Swiss  Creamy peanut butter  Plain custard or blended fruit yogurt  Moist casseroles, such as macaroni & cheese, tuna noodle  Grilled or toasted cheese sandwich Tough meats with a lot of gristle  Fried, highly seasoned, smoked and fatty meat, fish or poultry, such as frankfurters, luncheon meats, sausage, bacon, spare ribs, beef brisket, sardines, anchovies, duck and goose  Chili and other entrees made with pepper or chili pepper  Shellfish  Strongly flavored cheeses, such as sharp cheese, extra sharp  cheddar, cheese containing peppers or other seasonings  Crunchy peanut butter  Any yogurt with nuts, seeds, coconut, strawberries or raspberries  Potatoes and Starches Peeled, mashed or boiled white or sweet potatoes  Oven-baked potatoes without skin  Well cooked white rice, enriched noodles, barley, spaghetti, macaroni and other pastas Fried potatoes, potato skins and potato chips  Hard and soft taco shells  Fried, brown or wild rice  Soups Mildly flavored meat stocks  Cream soups made from allowed foods Highly seasoned soups and tomato based soups, cream soups made with gas producing vegetables, such as broccoli, cauliflower, onion, etc.  Sweets and Snacks Use in moderation and do not eat large amounts of sweets by themselves. Syrup, honey, jelly and seedless jam  Plain hard candies and plain candies made with allowed ingredients  Molasses  Marshmallows  Other candy made from allowed ingredients  Thin pretzels Jam, marmalade and preserves  Chocolate in any form  Any candy containing nuts, coconut, seeds, peppermint, spearmint or dried or fresh fruit  Popcorn, potato chips, tortilla chips  Soft or hard thick pretzels, such as sourdough  Vegetables Well cooked soft vegetables without seeds or skins, such as asparagus tips, beets, carrots, green and wax beans, chopped spinach, tender canned baby peas, squash and pumpkin Raw vegetables, tomatoes, tomato juice, tomato sauce and V-8 juice(tomato based products can irritate the stomach)  Gas producing vegetables, such as broccoli, Brussel sprouts, cabbage, cauliflower, onions, corn, cucumber, green peppers, rutabagas, turnips, radishes and sauerkraut  Dried beans, peas and lentils  Miscellaneous Salt and spices in moderation  Mustard and vinegar in moderation Fried or highly seasoned foods  Coconut and seeds  Pickles and olives  Chili sauces, ketchup, barbecue sauce, horseradish, black pepper, chili powder and onion and garlic seasonings   Any other strongly flavored seasoning, condiment, spice or herb not tolerated  Any food not tolerated

## 2024-03-17 NOTE — Progress Notes (Signed)
 Outpatient Surgical Follow Up  03/17/2024  Sophia Schmidt is an 73 y.o. female.   Chief Complaint  Patient presents with   Follow-up    HPI: Sophia Schmidt is a 73 y.o. female in consultation at the request of Dr. Unk.  She does have history of chronic reflux that is worsening when she lays supine and is partially alleviated by PPI.  She is currently on a PPI twice a day.  She also reports significant cough for last few months.  No specific alleviating or aggravating factors.  She did see Dr. Unk from GI and an EGD was performed which I have personally reviewed showing evidence of a moderate paraesophageal hernia.  No strictures.  No other intraluminal lesions.  She also had a recent CT scan last year of the abdomen  that I per reviewed showing evidence of a moderate paraesophageal hernia type III, I respectfully disagree w the swallow read as her hernia is a type III hernia w 1/3 stomach within mediastinum She still works and is able to perform more than 4 METS of activity without any shortness of or chest pain.  She also reports some epigastric intermittent abdominal pain that is mild to moderate sharp in nature no specific aggravating or aggravating factors. She Does have the sensation of feeling full after eating and she does have some dysphagia that is intermittent. CBC and BmP is normal. SHe comes w husband and son.   Past Medical History:  Diagnosis Date   Anemia    Dyspnea    due to pneumonia 2 years ago   Exocrine pancreatic insufficiency    GERD (gastroesophageal reflux disease)    History of hiatal hernia    Pneumonia    2 years ago left lung was damaged    Past Surgical History:  Procedure Laterality Date   ABDOMINAL HYSTERECTOMY  1998   partial   BREAST CYST ASPIRATION Left 10/17/2014   FNA done by Dr. Dellie   BREAST EXCISIONAL BIOPSY Left 1998   x 2   BREAST SURGERY Left 1998   removal of 2 fibrocystic masses   COLONOSCOPY  2011   COLONOSCOPY WITH  PROPOFOL  N/A 12/13/2016   Procedure: COLONOSCOPY WITH PROPOFOL ;  Surgeon: Therisa Bi, MD;  Location: [facility] ENDOSCOPY;  Service: Endoscopy;  Laterality: N/A;   COLONOSCOPY WITH PROPOFOL  N/A 12/26/2021   Procedure: COLONOSCOPY WITH PROPOFOL ;  Surgeon: Therisa Bi, MD;  Location: [facility] ENDOSCOPY;  Service: Gastroenterology;  Laterality: N/A;   ESOPHAGOGASTRODUODENOSCOPY (EGD) WITH PROPOFOL  N/A 12/13/2016   Procedure: ESOPHAGOGASTRODUODENOSCOPY (EGD) WITH PROPOFOL ;  Surgeon: Therisa Bi, MD;  Location: [facility] ENDOSCOPY;  Service: Endoscopy;  Laterality: N/A;   TONSILLECTOMY  1996   UPPER GI ENDOSCOPY N/A 02/27/2024   Procedure: ENDOSCOPY, UPPER GI TRACT with polypectomy;  Surgeon: Unk Corinn Skiff, MD;  Location: [facility];  Service: Endoscopy;  Laterality: N/A;    Family History  Problem Relation Age of Onset   Pancreatic cancer Mother        pancreatic   Alzheimer's disease Father    Breast cancer Sister    Lung cancer Sister        lung   Breast cancer Sister        46   Lung cancer Brother        lung   Colon cancer Neg Hx     Social History:  reports that she has never smoked. She has never been exposed to tobacco smoke. She has never used smokeless tobacco.  She reports that she does not drink alcohol and does not use drugs.  Allergies:  Allergies  Allergen Reactions   Penicillins Rash    Pt states  breaks her out    Medications reviewed.    ROS Full ROS performed and is otherwise negative other than what is stated in HPI   BP (!) 155/90   Pulse 67   Ht 5' 3 (1.6 m)   Wt 153 lb (69.4 kg)   SpO2 98%   BMI 27.10 kg/m   Physical Exam CONSTITUTIONAL: NAD. EYES: Pupils are equal, round, Sclera are non-icteric. EARS, NOSE, MOUTH AND THROAT: The oropharynx is clear. The oral mucosa is pink and moist. Hearing is intact to voice. LYMPH NODES:  Lymph nodes in the neck are normal. RESPIRATORY:  Lungs are clear. There is normal respiratory effort, with equal  breath sounds bilaterally, and without pathologic use of accessory muscles. CARDIOVASCULAR: Heart is regular without murmurs, gallops, or rubs. GI: The abdomen is  soft, nontender, and nondistended. There are no palpable masses. There is no hepatosplenomegaly. There are normal bowel sounds in all quadrants. GU: Rectal deferred.   MUSCULOSKELETAL: Normal muscle strength and tone. No cyanosis or edema.   SKIN: Turgor is good and there are no pathologic skin lesions or ulcers. NEUROLOGIC: Motor and sensation is grossly normal. Cranial nerves are grossly intact. PSYCH:  Oriented to person, place and time. Affect is normal.   Assessment/Plan: 73 year-old female with symptomatic  paraesophageal hernia type III .I  Discussed with the patient in detail about her disease process.  I do think that she will require repair given symptoms and given her pathology  Also discussed what surgery will entail and I do think that she will be a reasonable candidate for robotic approach.  The risk the benefits and the possible complications were discussed with her in detail. ( Bleeding, infection, esophageal and bowel injuries, bloating, diarrhea)    Plan for robotic paraesophageal H w Partial fundoplication 2 weeks, She seems to understand and is agreeable with the plan We also talked about duiet following procedure I personally spent a total of 40 minutes in the care of the patient today including performing a medically appropriate exam/evaluation, counseling and educating, placing orders, referring and communicating with other health care professionals, documenting clinical information in the EHR, independently interpreting and reviewing images studies and coordinating care.   Laneta Luna, MD [facility] General Surgeon

## 2024-03-18 ENCOUNTER — Ambulatory Visit (INDEPENDENT_AMBULATORY_CARE_PROVIDER_SITE_OTHER): Payer: No Typology Code available for payment source

## 2024-03-18 VITALS — Ht 63.0 in | Wt 153.0 lb

## 2024-03-18 DIAGNOSIS — Z0000 Encounter for general adult medical examination without abnormal findings: Secondary | ICD-10-CM | POA: Diagnosis not present

## 2024-03-18 NOTE — Patient Instructions (Addendum)
 Sophia Schmidt,  Thank you for taking the time for your Medicare Wellness Visit. I appreciate your continued commitment to your health goals. Please review the care plan we discussed, and feel free to reach out if I can assist you further.  Medicare recommends these wellness visits once per year to help you and your care team stay ahead of potential health issues. These visits are designed to focus on prevention, allowing your provider to concentrate on managing your acute and chronic conditions during your regular appointments.  Please note that Annual Wellness Visits do not include a physical exam. Some assessments may be limited, especially if the visit was conducted virtually. If needed, we may recommend a separate in-person follow-up with your provider.  Ongoing Care Seeing your primary care provider every 3 to 6 months helps us  monitor your health and provide consistent, personalized care.    Referrals If a referral was made during today's visit and you haven't received any updates within two weeks, please contact the referred provider directly to check on the status.  Recommended Screenings:  Health Maintenance  Topic Date Due   DTaP/Tdap/Td vaccine (1 - Tdap) Never done   Flu Shot  01/16/2024   COVID-19 Vaccine (8 - 2025-26 season) 02/16/2024   Medicare Annual Wellness Visit  03/18/2025   Breast Cancer Screening  05/01/2025   Colon Cancer Screening  12/27/2026   Pneumococcal Vaccine for age over 7  Completed   DEXA scan (bone density measurement)  Completed   Hepatitis C Screening  Completed   Zoster (Shingles) Vaccine  Completed   HPV Vaccine  Aged Out   Meningitis B Vaccine  Aged Out       03/18/2024    3:13 PM  Advanced Directives  Does Patient Have a Medical Advance Directive? No  Would patient like information on creating a medical advance directive? No - Patient declined   Advance Care Planning is important because it: Ensures you receive medical care that aligns  with your values, goals, and preferences. Provides guidance to your family and loved ones, reducing the emotional burden of decision-making during critical moments.  Vision: Annual vision screenings are recommended for early detection of glaucoma, cataracts, and diabetic retinopathy. These exams can also reveal signs of chronic conditions such as diabetes and high blood pressure.  Dental: Annual dental screenings help detect early signs of oral cancer, gum disease, and other conditions linked to overall health, including heart disease and diabetes.  Please see the attached documents for additional preventive care recommendations.

## 2024-03-18 NOTE — Progress Notes (Signed)
 Subjective:   Sophia Schmidt is a 73 y.o. who presents for a Medicare Wellness preventive visit.  As a reminder, Annual Wellness Visits don't include a physical exam, and some assessments may be limited, especially if this visit is performed virtually. We may recommend an in-person follow-up visit with your provider if needed.  Visit Complete: Virtual I connected with  Sophia Schmidt on 03/18/24 by a video and audio enabled telemedicine application and verified that I am speaking with the correct person using two identifiers.  Patient Location: Home  Provider Location: Home Office  I discussed the limitations of evaluation and management by telemedicine. The patient expressed understanding and agreed to proceed.  Vital Signs: Because this visit was a virtual/telehealth visit, some criteria may be missing or patient reported. Any vitals not documented were not able to be obtained and vitals that have been documented are patient reported.    Persons Participating in Visit: Patient.  AWV Questionnaire: No: Patient Medicare AWV questionnaire was not completed prior to this visit.  Cardiac Risk Factors include: advanced age (>70men, >73 women)     Objective:    Today's Vitals   03/18/24 1508  Weight: 153 lb (69.4 kg)  Height: 5' 3 (1.6 m)  PainSc: 0-No pain   Body mass index is 27.1 kg/m.     03/18/2024    3:13 PM 02/27/2024    7:15 AM 03/14/2023    1:10 PM 12/26/2021    7:19 AM 08/01/2021    4:20 PM 12/13/2016    9:32 AM 02/05/2015    8:38 AM  Advanced Directives  Does Patient Have a Medical Advance Directive? No No No Yes No Yes  No   Type of Aeronautical engineer of [place];Living will  Healthcare Power of [place];Living will   Copy of Healthcare Power of Attorney in Chart?      No - copy requested    Would patient like information on creating a medical advance directive? No - Patient declined No - Patient declined No - Patient declined  No - Patient  declined  Yes - Educational materials given      Data saved with a previous flowsheet row definition    Current Medications (verified) Outpatient Encounter Medications as of 03/18/2024  Medication Sig   albuterol  (VENTOLIN  HFA) 108 (90 Base) MCG/ACT inhaler Inhale 2 puffs into the lungs every 4 (four) hours as needed for wheezing or shortness of breath (cough).   Cholecalciferol (D3 ADULT PO) Take 1 tablet by mouth daily.   Cyanocobalamin (VITAMIN B 12 PO) Take 1 tablet by mouth daily.   ferrous sulfate 325 (65 FE) MG tablet Take 1 tablet by mouth daily.   fluticasone furoate-vilanterol (BREO ELLIPTA) 100-25 MCG/ACT AEPB Inhale 1 puff into the lungs as needed.   lipase/protease/amylase ([name] ) 12000-38000 units CPEP capsule Take 36,000 Units by mouth daily.   Multiple Vitamins-Minerals (MULTIVITAMIN WITH MINERALS) tablet Take 1 tablet by mouth daily.   No facility-administered encounter medications on file as of 03/18/2024.    Allergies (verified) Penicillins   History: Past Medical History:  Diagnosis Date   Anemia    Dyspnea    due to pneumonia 2 years ago   Exocrine pancreatic insufficiency    GERD (gastroesophageal reflux disease)    History of hiatal hernia    Pneumonia    2 years ago left lung was damaged   Past Surgical History:  Procedure Laterality Date   ABDOMINAL HYSTERECTOMY  1998  partial   BREAST CYST ASPIRATION Left 10/17/2014   FNA done by Dr. Dellie   BREAST EXCISIONAL BIOPSY Left 1998   x 2   BREAST SURGERY Left 1998   removal of 2 fibrocystic masses   COLONOSCOPY  2011   COLONOSCOPY WITH PROPOFOL  N/A 12/13/2016   Procedure: COLONOSCOPY WITH PROPOFOL ;  Surgeon: Therisa Bi, MD;  Location: [facility] ENDOSCOPY;  Service: Endoscopy;  Laterality: N/A;   COLONOSCOPY WITH PROPOFOL  N/A 12/26/2021   Procedure: COLONOSCOPY WITH PROPOFOL ;  Surgeon: Therisa Bi, MD;  Location: [facility] ENDOSCOPY;  Service: Gastroenterology;  Laterality: N/A;   ESOPHAGOGASTRODUODENOSCOPY  (EGD) WITH PROPOFOL  N/A 12/13/2016   Procedure: ESOPHAGOGASTRODUODENOSCOPY (EGD) WITH PROPOFOL ;  Surgeon: Therisa Bi, MD;  Location: [facility] ENDOSCOPY;  Service: Endoscopy;  Laterality: N/A;   TONSILLECTOMY  1996   UPPER GI ENDOSCOPY N/A 02/27/2024   Procedure: ENDOSCOPY, UPPER GI TRACT with polypectomy;  Surgeon: Unk Corinn Skiff, MD;  Location: [facility];  Service: Endoscopy;  Laterality: N/A;   Family History  Problem Relation Age of Onset   Pancreatic cancer Mother        pancreatic   Alzheimer's disease Father    Breast cancer Sister    Lung cancer Sister        lung   Breast cancer Sister        70   Lung cancer Brother        lung   Colon cancer Neg Hx    Social History   Socioeconomic History   Marital status: Divorced    Spouse name: Not on file   Number of children: Not on file   Years of education: Not on file   Highest education level: Not on file  Occupational History   Not on file  Tobacco Use   Smoking status: Never    Passive exposure: Never   Smokeless tobacco: Never  Vaping Use   Vaping status: Never Used  Substance and Sexual Activity   Alcohol use: No    Alcohol/week: 0.0 standard drinks of alcohol   Drug use: Never   Sexual activity: Yes  Other Topics Concern   Not on file  Social History Narrative   Not on file   Social Drivers of Health   Financial Resource Strain: Low Risk  (03/18/2024)   Overall Financial Resource Strain (CARDIA)    Difficulty of Paying Living Expenses: Not hard at all  Food Insecurity: No Food Insecurity (03/18/2024)   Hunger Vital Sign    Worried About Running Out of Food in the Last Year: Never true    Ran Out of Food in the Last Year: Never true  Transportation Needs: No Transportation Needs (03/18/2024)   PRAPARE - Administrator, Civil Service (Medical): No    Lack of Transportation (Non-Medical): No  Physical Activity: Inactive (03/18/2024)   Exercise Vital Sign    Days of Exercise per Week:  0 days    Minutes of Exercise per Session: 0 min  Stress: No Stress Concern Present (03/18/2024)   [employer] of Occupational Health - Occupational Stress Questionnaire    Feeling of Stress: Not at all  Social Connections: Moderately Integrated (03/18/2024)   Social Connection and Isolation Panel    Frequency of Communication with Friends and Family: More than three times a week    Frequency of Social Gatherings with Friends and Family: More than three times a week    Attends Religious Services: More than 4 times per year    Active  Member of Clubs or Organizations: Yes    Attends Engineer, structural: More than 4 times per year    Marital Status: Divorced    Tobacco Counseling Counseling given: Not Answered    Clinical Intake:  Pre-visit preparation completed: Yes  Pain : No/denies pain Pain Score: 0-No pain     BMI - recorded: 27.1 Nutritional Status: BMI 25 -29 Overweight Nutritional Risks: None Diabetes: No  Lab Results  Component Value Date   HGBA1C 5.4 03/09/2021   HGBA1C 5.4 01/03/2020   HGBA1C 5.5 08/20/2018     How often do you need to have someone help you when you read instructions, pamphlets, or other written materials from your doctor or pharmacy?: 1 - Never  Interpreter Needed?: No  Information entered by :: Rojelio Blush LPN   Activities of Daily Living     03/18/2024    3:13 PM 02/27/2024    7:09 AM  In your present state of health, do you have any difficulty performing the following activities:  Hearing? 0 0  Vision? 0 0  Difficulty concentrating or making decisions? 0 0  Walking or climbing stairs? 0   Dressing or bathing? 0   Doing errands, shopping? 0   Preparing Food and eating ? N   Using the Toilet? N   In the past six months, have you accidently leaked urine? N   Do you have problems with loss of bowel control? N   Managing your Medications? N   Managing your Finances? N   Housekeeping or managing your Housekeeping?  N     Patient Care Team: Edman Marsa PARAS, DO as PCP - General (Family Medicine) Sankar, Seeplaputhur G, MD (General Surgery)  I have updated your Care Teams any recent Medical Services you may have received from other providers in the past year.     Assessment:   This is a routine wellness examination for [place].  Hearing/Vision screen Hearing Screening - Comments:: Denies hearing difficulties   Vision Screening - Comments:: Wears rx glasses - up to date with routine eye exams with  [facility]   Goals Addressed               This Visit's Progress     Increase physical activity (pt-stated)        Remain active       Depression Screen     03/18/2024    3:12 PM 01/19/2024    3:27 PM 03/14/2023    1:09 PM 10/31/2022    8:23 AM 12/04/2020    3:19 PM 10/23/2020    2:22 PM 01/07/2020    3:42 PM  PHQ 2/9 Scores  PHQ - 2 Score 0 0 0 0 2 6 0  PHQ- 9 Score 0 2 0  16 9     Fall Risk     03/18/2024    3:13 PM 03/17/2024   11:16 AM 01/19/2024    3:26 PM 03/14/2023    1:13 PM 10/31/2022    8:23 AM  Fall Risk   Falls in the past year? 0 0 0 0 0  Number falls in past yr: 0 0  0   Injury with Fall? 0 0  0   Risk for fall due to : No Fall Risks   No Fall Risks   Follow up Falls evaluation completed   Falls prevention discussed;Falls evaluation completed     MEDICARE RISK AT HOME:  Medicare Risk at Home Any stairs in or  around the home?: No If so, are there any without handrails?: No Home free of loose throw rugs in walkways, pet beds, electrical cords, etc?: Yes Adequate lighting in your home to reduce risk of falls?: Yes Life alert?: No Use of a cane, walker or w/c?: No Grab bars in the bathroom?: Yes Shower chair or bench in shower?: No Elevated toilet seat or a handicapped toilet?: No  TIMED UP AND GO:  Was the test performed?  No  Cognitive Function: 6CIT completed        03/18/2024    3:14 PM 03/14/2023    1:16 PM  6CIT Screen  What Year? 0  points 0 points  What month? 0 points 0 points  What time? 0 points 0 points  Count back from 20 0 points 0 points  Months in reverse 0 points 0 points  Repeat phrase 0 points 0 points  Total Score 0 points 0 points    Immunizations Immunization History  Administered Date(s) Administered    sv, Bivalent, Protein Subunit Rsvpref,pf (Abrysvo) 03/23/2022   Fluad Quad(high Dose 65+) 03/12/2019, 03/29/2020, 03/14/2021   Influenza Inj Mdck Quad Pf 03/12/2019, 03/29/2020   Influenza,inj,Quad PF,6+ Mos 04/01/2016   Influenza,inj,quad, With Preservative 05/17/2016   Influenza-Unspecified 07/10/2019, 03/26/2020   PFIZER Comirnaty(Gray Top)Covid-19 Tri-Sucrose Vaccine 07/10/2019, 07/27/2019, 02/24/2020   PFIZER(Purple Top)SARS-COV-2 Vaccination 07/10/2019, 07/27/2019, 02/24/2020, 10/20/2020   PNEUMOCOCCAL CONJUGATE-20 06/25/2021   Pneumococcal Conjugate-13 08/18/2018   Pneumococcal-Unspecified 05/17/2016   Zoster Recombinant(Shingrix) 07/20/2021, 09/21/2021    Screening Tests Health Maintenance  Topic Date Due   DTaP/Tdap/Td (1 - Tdap) Never done   Influenza Vaccine  01/16/2024   COVID-19 Vaccine (8 - 2025-26 season) 02/16/2024   Medicare Annual Wellness (AWV)  03/18/2025   Mammogram  05/01/2025   Colonoscopy  12/27/2026   Pneumococcal Vaccine: 50+ Years  Completed   DEXA SCAN  Completed   Hepatitis C Screening  Completed   Zoster Vaccines- Shingrix  Completed   HPV VACCINES  Aged Out   Meningococcal B Vaccine  Aged Out    Health Maintenance Items Addressed:   Additional Screening:  Vision Screening: Recommended annual ophthalmology exams for early detection of glaucoma and other disorders of the eye. Is the patient up to date with their annual eye exam?  Yes  Who is the provider or what is the name of the office in which the patient attends annual eye exams?  [facility]  Dental Screening: Recommended annual dental exams for proper oral hygiene  Community Resource  Referral / Chronic Care Management: CRR required this visit?  No   CCM required this visit?  No   Plan:    I have personally reviewed and noted the following in the patient's chart:   Medical and social history Use of alcohol, tobacco or illicit drugs  Current medications and supplements including opioid prescriptions. Patient is not currently taking opioid prescriptions. Functional ability and status Nutritional status Physical activity Advanced directives List of other physicians Hospitalizations, surgeries, and ER visits in previous 12 months Vitals Screenings to include cognitive, depression, and falls Referrals and appointments  In addition, I have reviewed and discussed with patient certain preventive protocols, quality metrics, and best practice recommendations. A written personalized care plan for preventive services as well as general preventive health recommendations were provided to patient.   Rojelio LELON Blush, LPN   89/12/7972   After Visit Summary: (MyChart) Due to this being a telephonic visit, the after visit summary with patients personalized plan was offered  to patient via MyChart   Notes: Nothing significant to report at this time.

## 2024-03-25 ENCOUNTER — Other Ambulatory Visit: Payer: Self-pay

## 2024-03-25 ENCOUNTER — Encounter
Admission: RE | Admit: 2024-03-25 | Discharge: 2024-03-25 | Disposition: A | Discharge status: Home / Self Care | Source: Ambulatory Visit | Attending: Surgery | Admitting: Surgery

## 2024-03-25 VITALS — Ht 63.0 in | Wt 153.0 lb

## 2024-03-25 DIAGNOSIS — E7800 Pure hypercholesterolemia, unspecified: Secondary | ICD-10-CM

## 2024-03-25 DIAGNOSIS — D649 Anemia, unspecified: Secondary | ICD-10-CM

## 2024-03-25 NOTE — Patient Instructions (Addendum)
 Your procedure is scheduled on: Thursday 04/01/24 Report to the Registration Desk on the 1st floor of the [facility]. To find out your arrival time, please call [phone] between 1PM - 3PM on: Wednesday 03/31/24 If your arrival time is 6:00 am, do not arrive before that time as the [facility] entrance doors do not open until 6:00 am.  REMEMBER: Instructions that are not followed completely may result in serious medical risk, up to and including death; or upon the discretion of your surgeon and anesthesiologist your surgery may need to be rescheduled.  Do not eat food or drink any liquids after midnight the night before surgery.  No gum chewing or hard candies.  One week prior to surgery: Stop Anti-inflammatories (NSAIDS) such as Advil , Aleve , Ibuprofen , Motrin , Naproxen , Naprosyn  and Aspirin based products such as Excedrin, Goody's Powder, BC Powder.  You may however, continue to take Tylenol  if needed for pain up until the day of surgery.  Stop ANY OVER THE COUNTER supplements and votamins until after surgery.  Continue taking all of your other prescription medications up until the day of surgery.  ON THE DAY OF SURGERY ONLY TAKE THESE MEDICATIONS WITH SIPS OF WATER:  none  Use inhalers on the day of surgery and bring to the hospital.  No Alcohol for 24 hours before or after surgery.  No Smoking including e-cigarettes for 24 hours before surgery.  No chewable tobacco products for at least 6 hours before surgery.  No nicotine patches on the day of surgery.  Do not use any recreational drugs for at least a week (preferably 2 weeks) before your surgery.  Please be advised that the combination of cocaine and anesthesia may have negative outcomes, up to and including death. If you test positive for cocaine, your surgery will be cancelled.  On the morning of surgery brush your teeth with toothpaste and water, you may rinse your mouth with mouthwash if you wish. Do not  swallow any toothpaste or mouthwash.  Use CHG Soap or wipes as directed on instruction sheet.  Do not shave body hair from the neck down 48 hours before surgery.  Do not wear lotions, powders, deodorants or perfumes.   Do not wear jewelry, make-up, hairpins, clips or nail polish.  For welded (permanent) jewelry: bracelets, anklets, waist bands, etc.  Please have this removed prior to surgery.  If it is not removed, there is a chance that hospital personnel will need to cut it off on the day of surgery.  Contact lenses, hearing aids and dentures may not be worn into surgery.  Do not bring valuables to the hospital. [facility] is not responsible for any missing/lost belongings or valuables.   Notify your doctor ([place]) if there is any change in your medical condition (cold, fever, infection).  Wear comfortable clothing (specific to your surgery type) to the hospital.  After surgery, you can help prevent lung complications by doing breathing exercises.  Take deep breaths and cough every 1-2 hours. Your doctor may order a device called an Incentive Spirometer to help you take deep breaths.  When coughing or sneezing, hold a pillow firmly against your incision with both hands. This is called "splinting." Doing this helps protect your incision. It also decreases belly discomfort.  If you are being admitted to the hospital overnight, leave your suitcase in the car. After surgery it may be brought to your room.  In case of increased patient census, it may be necessary for you, the patient,  to continue your postoperative care in the Same Day Surgery department.  Please call the Pre-admissions Testing Dept. at [phone] if you have any questions about these instructions.  Surgery Visitation Policy:  Patients having surgery or a procedure may have two visitors.  Children under the age of 2 must have an adult with them who is not the patient.  Inpatient Visitation:    Visiting hours  are 7 a.m. to 8 p.m. Up to four visitors are allowed at one time in a patient room. The visitors may rotate out with other people during the day.  One visitor age 62 or older may stay with the patient overnight and must be in the room by 8 p.m.   Merchandiser, retail to address health-related social needs:  [email or web address]                                                                                                             Preparing for Surgery with CHLORHEXIDINE GLUCONATE (CHG) Soap  Chlorhexidine Gluconate (CHG) Soap  o An antiseptic cleaner that kills germs and bonds with the skin to continue killing germs even after washing  o Used for showering the night before surgery and morning of surgery  Before surgery, you can play an important role by reducing the number of germs on your skin.  CHG (Chlorhexidine gluconate) soap is an antiseptic cleanser which kills germs and bonds with the skin to continue killing germs even after washing.  Please do not use if you have an allergy to CHG or antibacterial soaps. If your skin becomes reddened/irritated stop using the CHG.  1. Shower the NIGHT BEFORE SURGERY with CHG soap.  2. If you choose to wash your hair, wash your hair first as usual with your normal shampoo.  3. After shampooing, rinse your hair and body thoroughly to remove the shampoo.  4. Use CHG as you would any other liquid soap. You can apply CHG directly to the skin and wash gently with a clean washcloth.  5. Apply the CHG soap to your body only from the neck down. Do not use on open wounds or open sores. Avoid contact with your eyes, ears, mouth, and genitals (private parts). Wash face and genitals (private parts) with your normal soap.  6. Wash thoroughly, paying special attention to the area where your surgery will be performed.  7. Thoroughly rinse your body with warm water.  8. Do not shower/wash with your normal soap after using and  rinsing off the CHG soap.  9. Do not use lotions, oils, etc., after showering with CHG.  10. Pat yourself dry with a clean towel.  11. Wear clean pajamas to bed the night before surgery.  12. Place clean sheets on your bed the night of your shower and do not sleep with pets.  13. Do not apply any deodorants/lotions/powders.  14. Please wear clean clothes to the hospital.  15. Remember to brush your teeth with your regular toothpaste.  How to Use an Facilities manager  An incentive spirometer  is a tool that measures how well you are filling your lungs with each breath. Learning to take long, deep breaths using this tool can help you keep your lungs clear and active. This may help to reverse or lessen your chance of developing breathing (pulmonary) problems, especially infection. You may be asked to use a spirometer: After a surgery. If you have a lung problem or a history of smoking. After a long period of time when you have been unable to move or be active. If the spirometer includes an indicator to show the highest number that you have reached, your health care provider or respiratory therapist will help you set a goal. Keep a log of your progress as told by your health care provider. What are the risks? Breathing too quickly may cause dizziness or cause you to pass out. Take your time so you do not get dizzy or light-headed. If you are in pain, you may need to take pain medicine before doing incentive spirometry. It is harder to take a deep breath if you are having pain. How to use your incentive spirometer  Sit up on the edge of your bed or on a chair. Hold the incentive spirometer so that it is in an upright position. Before you use the spirometer, breathe out normally. Place the mouthpiece in your mouth. Make sure your lips are closed tightly around it. Breathe in slowly and as deeply as you can through your mouth, causing the piston or the ball to rise toward the top of the  chamber. Hold your breath for 3-5 seconds, or for as long as possible. If the spirometer includes a coach indicator, use this to guide you in breathing. Slow down your breathing if the indicator goes above the marked areas. Remove the mouthpiece from your mouth and breathe out normally. The piston or ball will return to the bottom of the chamber. Rest for a few seconds, then repeat the steps 10 or more times. Take your time and take a few normal breaths between deep breaths so that you do not get dizzy or light-headed. Do this every 1-2 hours when you are awake. If the spirometer includes a goal marker to show the highest number you have reached (best effort), use this as a goal to work toward during each repetition. After each set of 10 deep breaths, cough a few times. This will help to make sure that your lungs are clear. If you have an incision on your chest or abdomen from surgery, place a pillow or a rolled-up towel firmly against the incision when you cough. This can help to reduce pain while taking deep breaths and coughing. General tips When you are able to get out of bed: Walk around often. Continue to take deep breaths and cough in order to clear your lungs. Keep using the incentive spirometer until your health care provider says it is okay to stop using it. If you have been in the hospital, you may be told to keep using the spirometer at home. Contact a health care provider if: You are having difficulty using the spirometer. You have trouble using the spirometer as often as instructed. Your pain medicine is not giving enough relief for you to use the spirometer as told. You have a fever. Get help right away if: You develop shortness of breath. You develop a cough with bloody mucus from the lungs. You have fluid or blood coming from an incision site after you cough. Summary An incentive spirometer is  a tool that can help you learn to take long, deep breaths to keep your lungs clear  and active. You may be asked to use a spirometer after a surgery, if you have a lung problem or a history of smoking, or if you have been inactive for a long period of time. Use your incentive spirometer as instructed every 1-2 hours while you are awake. If you have an incision on your chest or abdomen, place a pillow or a rolled-up towel firmly against your incision when you cough. This will help to reduce pain. Get help right away if you have shortness of breath, you cough up bloody mucus, or blood comes from your incision when you cough. This information is not intended to replace advice given to you by your health care provider. Make sure you discuss any questions you have with your health care provider. Document Revised: 08/23/2019 Document Reviewed: 08/23/2019 Elsevier Patient Education  2023 [employer].

## 2024-03-30 ENCOUNTER — Encounter
Admission: RE | Admit: 2024-03-30 | Discharge: 2024-03-30 | Disposition: A | Discharge status: Home / Self Care | Source: Ambulatory Visit | Attending: Surgery | Admitting: Surgery

## 2024-03-30 ENCOUNTER — Encounter: Payer: Self-pay | Admitting: Urgent Care

## 2024-03-30 DIAGNOSIS — E7800 Pure hypercholesterolemia, unspecified: Secondary | ICD-10-CM | POA: Insufficient documentation

## 2024-03-30 DIAGNOSIS — Z01810 Encounter for preprocedural cardiovascular examination: Secondary | ICD-10-CM | POA: Diagnosis not present

## 2024-03-30 DIAGNOSIS — Z01818 Encounter for other preprocedural examination: Secondary | ICD-10-CM | POA: Diagnosis not present

## 2024-03-30 DIAGNOSIS — Z01812 Encounter for preprocedural laboratory examination: Secondary | ICD-10-CM | POA: Diagnosis present

## 2024-03-30 DIAGNOSIS — R9431 Abnormal electrocardiogram [ECG] [EKG]: Secondary | ICD-10-CM | POA: Diagnosis not present

## 2024-03-30 DIAGNOSIS — D649 Anemia, unspecified: Secondary | ICD-10-CM | POA: Insufficient documentation

## 2024-03-30 LAB — BASIC METABOLIC PANEL WITH GFR
Anion gap: 11 (ref 5–15)
BUN: 10 mg/dL (ref 8–23)
CO2: 25 mmol/L (ref 22–32)
Calcium: 10.8 mg/dL — ABNORMAL HIGH (ref 8.9–10.3)
Chloride: 108 mmol/L (ref 98–111)
Creatinine, Ser: 0.5 mg/dL (ref 0.44–1.00)
GFR, Estimated: >60 mL/min (ref >60)
Glucose, Bld: 95 mg/dL (ref 70–99)
Potassium: 3.6 mmol/L (ref 3.5–5.1)
Sodium: 144 mmol/L (ref 135–145)

## 2024-03-30 LAB — CBC
HCT: 36.4 % (ref 36.0–46.0)
Hemoglobin: 12.3 g/dL (ref 12.0–15.0)
MCH: 26.8 pg (ref 26.0–34.0)
MCHC: 33.8 g/dL (ref 30.0–36.0)
MCV: 79.3 fL — ABNORMAL LOW (ref 80.0–100.0)
Platelets: 272 K/uL (ref 150–400)
RBC: 4.59 MIL/uL (ref 3.87–5.11)
RDW: 14.3 % (ref 11.5–15.5)
WBC: 6 K/uL (ref 4.0–10.5)
nRBC: 0 % (ref 0.0–0.2)

## 2024-04-01 ENCOUNTER — Encounter
Admission: RE | Disposition: A | Discharge status: Home / Self Care | Payer: Self-pay | Source: Home / Self Care | Attending: Surgery

## 2024-04-01 ENCOUNTER — Encounter: Payer: Self-pay | Admitting: Surgery

## 2024-04-01 ENCOUNTER — Observation Stay
Admission: RE | Admit: 2024-04-01 | Discharge: 2024-04-02 | Disposition: A | Discharge status: Home / Self Care | Attending: Surgery | Admitting: Surgery

## 2024-04-01 ENCOUNTER — Ambulatory Visit

## 2024-04-01 ENCOUNTER — Other Ambulatory Visit: Payer: Self-pay

## 2024-04-01 ENCOUNTER — Ambulatory Visit: Payer: Self-pay | Admitting: Urgent Care

## 2024-04-01 DIAGNOSIS — K449 Diaphragmatic hernia without obstruction or gangrene: Secondary | ICD-10-CM | POA: Diagnosis present

## 2024-04-01 DIAGNOSIS — Z8719 Personal history of other diseases of the digestive system: Principal | ICD-10-CM

## 2024-04-01 HISTORY — PX: INSERTION OF MESH: SHX5868

## 2024-04-01 HISTORY — PX: XI ROBOTIC ASSISTED PARAESOPHAGEAL HERNIA REPAIR: SHX6871

## 2024-04-01 SURGERY — REPAIR, HERNIA, PARAESOPHAGEAL, ROBOT-ASSISTED
Anesthesia: General

## 2024-04-01 MED ORDER — CHLORHEXIDINE GLUCONATE CLOTH 2 % EX PADS
6.0000 | MEDICATED_PAD | Freq: Once | CUTANEOUS | Status: DC
  Administered 2024-04-01: 6 via TOPICAL

## 2024-04-01 MED ORDER — LIDOCAINE HCL (PF) 2 % IJ SOLN
INTRAMUSCULAR | Status: AC
  Filled 2024-04-01: qty 5

## 2024-04-01 MED ORDER — HYDROMORPHONE HCL 1 MG/ML IJ SOLN
INTRAMUSCULAR | Status: AC
  Filled 2024-04-01: qty 1

## 2024-04-01 MED ORDER — ACETAMINOPHEN 500 MG PO TABS
1000.0000 mg | ORAL_TABLET | ORAL | Status: AC
  Administered 2024-04-01: 1000 mg via ORAL

## 2024-04-01 MED ORDER — FENTANYL CITRATE (PF) 50 MCG/ML IJ SOSY: 25.0000 ug | PREFILLED_SYRINGE | INTRAMUSCULAR | Status: DC | PRN

## 2024-04-01 MED ORDER — VISTASEAL 10 ML SINGLE DOSE KIT
PACK | CUTANEOUS | Status: AC
  Filled 2024-04-01: qty 10

## 2024-04-01 MED ORDER — BUPIVACAINE-EPINEPHRINE 0.25% -1:200000 IJ SOLN
INTRAMUSCULAR | Status: DC | PRN
  Administered 2024-04-01: 30 mL

## 2024-04-01 MED ORDER — DIPHENHYDRAMINE HCL 12.5 MG/5ML PO ELIX: 12.5000 mg | ORAL_SOLUTION | Freq: Four times a day (QID) | ORAL | Status: DC | PRN

## 2024-04-01 MED ORDER — DIPHENHYDRAMINE HCL 50 MG/ML IJ SOLN: 12.5000 mg | Freq: Four times a day (QID) | INTRAMUSCULAR | Status: DC | PRN

## 2024-04-01 MED ORDER — CHLORHEXIDINE GLUCONATE 0.12 % MT SOLN
OROMUCOSAL | Status: AC
  Filled 2024-04-01: qty 15

## 2024-04-01 MED ORDER — MELATONIN 5 MG PO TABS: 5.0000 mg | ORAL_TABLET | Freq: Every evening | ORAL | Status: DC | PRN

## 2024-04-01 MED ORDER — ONDANSETRON HCL 4 MG/2ML IJ SOLN
INTRAMUSCULAR | Status: AC
  Filled 2024-04-01: qty 2

## 2024-04-01 MED ORDER — LIDOCAINE HCL (CARDIAC) PF 100 MG/5ML IV SOSY
PREFILLED_SYRINGE | INTRAVENOUS | Status: DC | PRN
  Administered 2024-04-01: 80 mg via INTRAVENOUS

## 2024-04-01 MED ORDER — DEXMEDETOMIDINE HCL IN NACL 80 MCG/20ML IV SOLN
INTRAVENOUS | Status: DC | PRN
  Administered 2024-04-01: 8 ug via INTRAVENOUS

## 2024-04-01 MED ORDER — PROCHLORPERAZINE MALEATE 10 MG PO TABS: 10.0000 mg | ORAL_TABLET | Freq: Four times a day (QID) | ORAL | Status: DC | PRN

## 2024-04-01 MED ORDER — EPHEDRINE SULFATE-NACL 50-0.9 MG/10ML-% IV SOSY
PREFILLED_SYRINGE | INTRAVENOUS | Status: DC | PRN
Start: 2024-04-01 — End: 2024-04-01
  Administered 2024-04-01: 10 mg via INTRAVENOUS

## 2024-04-01 MED ORDER — INDOCYANINE GREEN 25 MG IV SOLR
INTRAVENOUS | Status: DC | PRN
  Administered 2024-04-01: 10 mg via TOPICAL

## 2024-04-01 MED ORDER — PROPOFOL 1000 MG/100ML IV EMUL
INTRAVENOUS | Status: AC
  Filled 2024-04-01: qty 100

## 2024-04-01 MED ORDER — FENTANYL CITRATE (PF) 100 MCG/2ML IJ SOLN
INTRAMUSCULAR | Status: DC | PRN
  Administered 2024-04-01 (×2): 25 ug via INTRAVENOUS
  Administered 2024-04-01: 50 ug via INTRAVENOUS

## 2024-04-01 MED ORDER — CEFAZOLIN SODIUM-DEXTROSE 2-4 GM/100ML-% IV SOLN
2.0000 g | Freq: Three times a day (TID) | INTRAVENOUS | Status: DC
  Administered 2024-04-01 – 2024-04-02 (×2): 2 g via INTRAVENOUS
  Filled 2024-04-01 (×3): qty 100

## 2024-04-01 MED ORDER — PROCHLORPERAZINE EDISYLATE 10 MG/2ML IJ SOLN
5.0000 mg | Freq: Four times a day (QID) | INTRAMUSCULAR | Status: DC | PRN
  Administered 2024-04-01: 10 mg via INTRAVENOUS
  Filled 2024-04-01: qty 2

## 2024-04-01 MED ORDER — BUPIVACAINE-EPINEPHRINE (PF) 0.25% -1:200000 IJ SOLN
INTRAMUSCULAR | Status: AC
  Filled 2024-04-01: qty 30

## 2024-04-01 MED ORDER — KETOROLAC TROMETHAMINE 15 MG/ML IJ SOLN
15.0000 mg | Freq: Four times a day (QID) | INTRAMUSCULAR | Status: DC
  Administered 2024-04-01 – 2024-04-02 (×3): 15 mg via INTRAVENOUS
  Filled 2024-04-01 (×2): qty 1

## 2024-04-01 MED ORDER — HYDRALAZINE HCL 20 MG/ML IJ SOLN
10.0000 mg | INTRAMUSCULAR | Status: DC | PRN
  Administered 2024-04-01: 10 mg via INTRAVENOUS
  Filled 2024-04-01: qty 0.5

## 2024-04-01 MED ORDER — LABETALOL HCL 5 MG/ML IV SOLN
5.0000 mg | INTRAVENOUS | Status: DC | PRN
  Administered 2024-04-01 (×2): 5 mg via INTRAVENOUS

## 2024-04-01 MED ORDER — VISTASEAL 10 ML SINGLE DOSE KIT
PACK | CUTANEOUS | Status: DC | PRN
  Administered 2024-04-01: 10 mL via TOPICAL

## 2024-04-01 MED ORDER — GABAPENTIN 300 MG PO CAPS
ORAL_CAPSULE | ORAL | Status: AC
  Filled 2024-04-01: qty 1

## 2024-04-01 MED ORDER — MORPHINE SULFATE (PF) 2 MG/ML IV SOLN
2.0000 mg | INTRAVENOUS | Status: DC | PRN
  Administered 2024-04-01: 2 mg via INTRAVENOUS
  Filled 2024-04-01: qty 1

## 2024-04-01 MED ORDER — ONDANSETRON 4 MG PO TBDP: 4.0000 mg | ORAL_TABLET | Freq: Four times a day (QID) | ORAL | Status: DC | PRN

## 2024-04-01 MED ORDER — CEFAZOLIN SODIUM-DEXTROSE 2-4 GM/100ML-% IV SOLN
INTRAVENOUS | Status: AC
  Filled 2024-04-01: qty 100

## 2024-04-01 MED ORDER — ALBUTEROL SULFATE (2.5 MG/3ML) 0.083% IN NEBU: 2.5000 mg | INHALATION_SOLUTION | RESPIRATORY_TRACT | Status: DC | PRN

## 2024-04-01 MED ORDER — ROCURONIUM BROMIDE 10 MG/ML (PF) SYRINGE
PREFILLED_SYRINGE | INTRAVENOUS | Status: AC
  Filled 2024-04-01: qty 10

## 2024-04-01 MED ORDER — PHENYLEPHRINE 80 MCG/ML (10ML) SYRINGE FOR IV PUSH (FOR BLOOD PRESSURE SUPPORT)
PREFILLED_SYRINGE | INTRAVENOUS | Status: AC
  Filled 2024-04-01: qty 10

## 2024-04-01 MED ORDER — ONDANSETRON HCL 4 MG/2ML IJ SOLN: 4.0000 mg | Freq: Four times a day (QID) | INTRAMUSCULAR | Status: DC | PRN

## 2024-04-01 MED ORDER — PROPOFOL 10 MG/ML IV BOLUS
INTRAVENOUS | Status: AC
  Filled 2024-04-01: qty 20

## 2024-04-01 MED ORDER — ENOXAPARIN SODIUM 40 MG/0.4ML IJ SOSY: 40.0000 mg | PREFILLED_SYRINGE | INTRAMUSCULAR | Status: DC

## 2024-04-01 MED ORDER — SUGAMMADEX SODIUM 200 MG/2ML IV SOLN
INTRAVENOUS | Status: DC | PRN
  Administered 2024-04-01: 50 mg via INTRAVENOUS
  Administered 2024-04-01: 100 mg via INTRAVENOUS
  Administered 2024-04-01: 50 mg via INTRAVENOUS

## 2024-04-01 MED ORDER — PROPOFOL 10 MG/ML IV BOLUS
INTRAVENOUS | Status: DC | PRN
  Administered 2024-04-01: 100 ug/kg/min via INTRAVENOUS
  Administered 2024-04-01: 110 mg via INTRAVENOUS

## 2024-04-01 MED ORDER — ROCURONIUM BROMIDE 100 MG/10ML IV SOLN
INTRAVENOUS | Status: DC | PRN
  Administered 2024-04-01: 60 mg via INTRAVENOUS
  Administered 2024-04-01: 20 mg via INTRAVENOUS

## 2024-04-01 MED ORDER — CHLORHEXIDINE GLUCONATE 0.12 % MT SOLN
15.0000 mL | Freq: Once | OROMUCOSAL | Status: AC
Start: 2024-04-01 — End: 2024-04-01
  Administered 2024-04-01: 15 mL via OROMUCOSAL

## 2024-04-01 MED ORDER — OXYCODONE HCL 5 MG PO TABS
5.0000 mg | ORAL_TABLET | ORAL | Status: DC | PRN
  Administered 2024-04-02: 5 mg via ORAL
  Filled 2024-04-01: qty 1

## 2024-04-01 MED ORDER — FENTANYL CITRATE (PF) 100 MCG/2ML IJ SOLN
INTRAMUSCULAR | Status: AC
  Filled 2024-04-01: qty 2

## 2024-04-01 MED ORDER — ACETAMINOPHEN 500 MG PO TABS
ORAL_TABLET | ORAL | Status: AC
  Filled 2024-04-01: qty 2

## 2024-04-01 MED ORDER — GABAPENTIN 300 MG PO CAPS
300.0000 mg | ORAL_CAPSULE | ORAL | Status: AC
  Administered 2024-04-01: 300 mg via ORAL

## 2024-04-01 MED ORDER — CEFAZOLIN SODIUM-DEXTROSE 2-4 GM/100ML-% IV SOLN
2.0000 g | INTRAVENOUS | Status: AC
  Administered 2024-04-01: 2 g via INTRAVENOUS

## 2024-04-01 MED ORDER — MIDAZOLAM HCL (PF) 2 MG/2ML IJ SOLN
INTRAMUSCULAR | Status: DC | PRN
  Administered 2024-04-01: 2 mg via INTRAVENOUS

## 2024-04-01 MED ORDER — HYDRALAZINE HCL 20 MG/ML IJ SOLN
INTRAMUSCULAR | Status: AC
  Filled 2024-04-01: qty 1

## 2024-04-01 MED ORDER — CELECOXIB 200 MG PO CAPS
ORAL_CAPSULE | ORAL | Status: AC
Start: 2024-04-01 — End: 2024-04-01
  Filled 2024-04-01: qty 1

## 2024-04-01 MED ORDER — MIDAZOLAM HCL 2 MG/2ML IJ SOLN
INTRAMUSCULAR | Status: AC
  Filled 2024-04-01: qty 2

## 2024-04-01 MED ORDER — CELECOXIB 200 MG PO CAPS
200.0000 mg | ORAL_CAPSULE | ORAL | Status: AC
  Administered 2024-04-01: 200 mg via ORAL

## 2024-04-01 MED ORDER — ACETAMINOPHEN 500 MG PO TABS
1000.0000 mg | ORAL_TABLET | Freq: Four times a day (QID) | ORAL | Status: DC
  Administered 2024-04-01 – 2024-04-02 (×2): 1000 mg via ORAL
  Filled 2024-04-01 (×2): qty 2

## 2024-04-01 MED ORDER — KETOROLAC TROMETHAMINE 15 MG/ML IJ SOLN
INTRAMUSCULAR | Status: AC
  Filled 2024-04-01: qty 1

## 2024-04-01 MED ORDER — LACTATED RINGERS IV SOLN: INTRAVENOUS | Status: DC

## 2024-04-01 MED ORDER — LABETALOL HCL 5 MG/ML IV SOLN
INTRAVENOUS | Status: AC
  Filled 2024-04-01: qty 4

## 2024-04-01 MED ORDER — DEXAMETHASONE SOD PHOSPHATE PF 10 MG/ML IJ SOLN
INTRAMUSCULAR | Status: DC | PRN
  Administered 2024-04-01: 10 mg via INTRAVENOUS

## 2024-04-01 MED ORDER — SODIUM CHLORIDE 0.9 % IV SOLN: INTRAVENOUS | Status: DC

## 2024-04-01 MED ORDER — CHLORHEXIDINE GLUCONATE CLOTH 2 % EX PADS
6.0000 | MEDICATED_PAD | Freq: Once | CUTANEOUS | Status: AC
  Administered 2024-04-01: 6 via TOPICAL

## 2024-04-01 MED ORDER — PHENYLEPHRINE 80 MCG/ML (10ML) SYRINGE FOR IV PUSH (FOR BLOOD PRESSURE SUPPORT)
PREFILLED_SYRINGE | INTRAVENOUS | Status: DC | PRN
  Administered 2024-04-01: 160 ug via INTRAVENOUS

## 2024-04-01 MED ORDER — PHENYLEPHRINE HCL-NACL 20-0.9 MG/250ML-% IV SOLN
INTRAVENOUS | Status: AC
  Filled 2024-04-01: qty 250

## 2024-04-01 MED ORDER — ORAL CARE MOUTH RINSE: 15.0000 mL | Freq: Once | OROMUCOSAL | Status: AC

## 2024-04-01 MED ORDER — FENTANYL CITRATE (PF) 100 MCG/2ML IJ SOLN
25.0000 ug | INTRAMUSCULAR | Status: DC | PRN
  Administered 2024-04-01 (×4): 25 ug via INTRAVENOUS

## 2024-04-01 MED ORDER — ONDANSETRON HCL 4 MG/2ML IJ SOLN
INTRAMUSCULAR | Status: DC | PRN
  Administered 2024-04-01: 4 mg via INTRAVENOUS

## 2024-04-01 MED ORDER — HYDROMORPHONE HCL 1 MG/ML IJ SOLN
INTRAMUSCULAR | Status: DC | PRN
  Administered 2024-04-01: .5 mg via INTRAVENOUS

## 2024-04-01 SURGICAL SUPPLY — 44 items
APPLICATOR VISTASEAL 35 (MISCELLANEOUS) IMPLANT
CANNULA REDUCER 12-8 DVNC XI (CANNULA) ×2 IMPLANT
CLIP LIGATING HEM O LOK PURPLE (MISCELLANEOUS) IMPLANT
DEFOGGER SCOPE WARM SEASHARP (MISCELLANEOUS) ×2 IMPLANT
DERMABOND ADVANCED .7 DNX12 (GAUZE/BANDAGES/DRESSINGS) ×2 IMPLANT
DRAPE ARM DVNC X/XI (DISPOSABLE) ×8 IMPLANT
DRAPE COLUMN DVNC XI (DISPOSABLE) ×2 IMPLANT
ELECTRODE REM PT RTRN 9FT ADLT (ELECTROSURGICAL) ×2 IMPLANT
FORCEPS BPLR R/ABLATION 8 DVNC (INSTRUMENTS) ×2 IMPLANT
GLOVE BIO SURGEON STRL SZ7 (GLOVE) ×6 IMPLANT
GOWN STRL REUS W/ TWL LRG LVL3 (GOWN DISPOSABLE) ×8 IMPLANT
GRASPER LAPSCPC 5X45 DSP (INSTRUMENTS) ×2 IMPLANT
GRASPER TIP-UP FEN DVNC XI (INSTRUMENTS) ×2 IMPLANT
IRRIGATION STRYKERFLOW (MISCELLANEOUS) IMPLANT
IV 0.9% NACL 1000 ML (IV SOLUTION) IMPLANT
KIT IMAGING PINPOINTPAQ (MISCELLANEOUS) ×2 IMPLANT
KIT PINK PAD W/HEAD ARM REST (MISCELLANEOUS) ×2 IMPLANT
LABEL OR SOLS (LABEL) ×2 IMPLANT
MANIFOLD NEPTUNE II (INSTRUMENTS) ×2 IMPLANT
MESH BIO-A 7X10 SYN MAT (Mesh General) IMPLANT
NDL DRIVE SUT CUT DVNC (INSTRUMENTS) ×2 IMPLANT
NDL HYPO 22X1.5 SAFETY MO (MISCELLANEOUS) ×2 IMPLANT
NEEDLE DRIVE SUT CUT DVNC (INSTRUMENTS) ×2 IMPLANT
NEEDLE HYPO 22X1.5 SAFETY MO (MISCELLANEOUS) ×2 IMPLANT
OBTURATOR OPTICALSTD 8 DVNC (TROCAR) ×2 IMPLANT
PACK LAP CHOLECYSTECTOMY (MISCELLANEOUS) ×2 IMPLANT
SEAL UNIV 5-12 XI (MISCELLANEOUS) ×8 IMPLANT
SEALER VESSEL EXT DVNC XI (MISCELLANEOUS) ×2 IMPLANT
SOLUTION ELECTROSURG ANTI STCK (MISCELLANEOUS) ×2 IMPLANT
SPIKE FLUID TRANSFER (MISCELLANEOUS) ×2 IMPLANT
SPONGE T-LAP 18X18 ~~LOC~~+RFID (SPONGE) ×2 IMPLANT
SUT SILK 2 0 SH (SUTURE) ×4 IMPLANT
SUT STRATA 2-0 23CM CT-2 (SUTURE) ×2 IMPLANT
SUT VIC AB 3-0 SH 27X BRD (SUTURE) IMPLANT
SUT VICRYL 0 UR6 27IN ABS (SUTURE) ×4 IMPLANT
SUTURE MNCRL 4-0 27XMF (SUTURE) ×2 IMPLANT
SYR 30ML LL (SYRINGE) ×2 IMPLANT
SYRINGE TOOMEY IRRIG 70ML (MISCELLANEOUS) ×2 IMPLANT
SYSTEM BAG RETRIEVAL 10MM (BASKET) IMPLANT
TRAP FLUID SMOKE EVACUATOR (MISCELLANEOUS) ×2 IMPLANT
TRAY FOLEY SLVR 16FR LF STAT (SET/KITS/TRAYS/PACK) ×2 IMPLANT
TROCAR Z-THREAD FIOS 5X100MM (TROCAR) ×2 IMPLANT
TUBING EVAC SMOKE HEATED PNEUM (TUBING) ×2 IMPLANT
WATER STERILE IRR 500ML POUR (IV SOLUTION) ×2 IMPLANT

## 2024-04-01 NOTE — Interval H&P Note (Signed)
 History and Physical Interval Note:  04/01/2024 10:11 AM  Sophia Schmidt  has presented today for surgery, with the diagnosis of Hiatal hernia.  The various methods of treatment have been discussed with the patient and family. After consideration of risks, benefits and other options for treatment, the patient has consented to  Procedure(s): REPAIR, HERNIA, PARAESOPHAGEAL, ROBOT-ASSISTED (N/A) as a surgical intervention.  The patient's history has been reviewed, patient examined, no change in status, stable for surgery.  I have reviewed the patient's chart and labs.  Questions were answered to the patient's satisfaction.     Ren Aspinall F Lafawn Lenoir

## 2024-04-01 NOTE — Op Note (Signed)
 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 Required due to the complexity of the case the need for exposure and lack of first assist.   Anesthesia: Gen. 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 The benefits, complications, treatment options, and expected outcomes were discussed with the patient. The risks of bleeding, infection, recurrence of symptoms, failure to resolve symptoms,  esophageal damage, Dysphagia, bowel injury, any of which could require further surgery were reviewed with the patient. The likelihood of improving the patient's symptoms with return to their baseline status is good.  The patient and/or family concurred with the proposed plan, giving informed consent.  The patient was taken to Operating Room, identified  and the procedure verified.  A Time Out was held and the above information confirmed.   Prior to the induction of general anesthesia, antibiotic prophylaxis was administered. VTE prophylaxis was in place. General endotracheal anesthesia was then administered and tolerated well. After the induction, the abdomen was prepped with Chloraprep and draped in the sterile fashion. The patient was positioned in the supine  position.   Cut down technique was used to enter the abdominal cavity and a Hasson trochar was placed after two vicryl stitches were anchored to the fascia. Pneumoperitoneum was then created with CO2 and tolerated well without any adverse changes in the patient's vital signs.  Three 8-mm ports were placed under direct vision. All skin incisions  were infiltrated with a local anesthetic agent before making the incision and placing the trocars. An additional 5 mm regular laparoscopic port was placed to assist with retraction and exposure.    The patient was positioned  in reverse Trendelenburg, robot was brought to the surgical field and docked in the standard fashion.  We made sure all the instrumentation was kept indirect view at all times and that there were no collision between the arms. I scrubbed out and went to the console.   I used a robotic arm to retract the liver, the vessel sealer on my right hand and a forced bipolar grasper on my left hand.  There is along the extra 5 mm port allow me ample exposure and the ability to perform meticulous dissection   We Started dividing the lesser omentum via the pars flaccida.  We Were able to dissect the lesser curvature of the stomach and  dissected the fundus free from the right and left crus.  We circumferentially dissected the GE junction. Please note that the fundus was displaced all the way up into the mediastinum and hernia sac had significant adhesion to the mediastinum. It required meticulous and extensive mediastinal dissection. We did perform a very good dissection within the mediastinum to allow a complete reduction of the  sac, gain esophageal length and a to completely allow an intra-abdominal fundoplication. We did not require a lengthening procedure. In order to reduce the sac  completely and to restore the stomach to the abdominal cavity the left gastric pedicle needed to be ligated with xlarge clips. THis maneuver allowed us  to bring the  stomach to the appropriate anatomical position without creating tension on the repair.   Using two strips of Bio-A as pledgets we approximated the crus with a 2-0V Stratafix suture. A bio-A 10x7 cm mesh was inserted and secured using Vistaseal.   We Asked anesthesia to initially placed ICG via the OG, no evidence of esophageal or gastric perforation were seen, Anesthesia also placed sequential Bougies from 30 to a 50 French bougie and they went easily.  We also observe trajectory of the bougies. 320 degree fundoplication was created with multiple 2-0 silk sutures and we placed 3 stitches taking some of the esophagus within that bite.  The fundoplication measured approximately 3-1/2 cm and he was floppy. I was very happy with the way the fundoplication laid and the repair of the hernia.   Inspection of the  upper quadrant was performed. No bleeding, bile  Or esophageal injuries leaks, or bowel injuries were noted. Robotic instruments and robotic arms were undocked in the standard fashion. All the needles were removed under direct visualization.   I scrubbed back in.   Pneumoperitoneum was released.  The periumbilical port site was closed with interrumpted 0 Vicryl sutures. 4-0 subcuticular Monocryl was used to close the skin. marcaine was injected to all the incisions sites.   Dermabond was  applied.  The patient was then extubated and brought to the recovery room in stable condition. Sponge, lap, and needle counts were correct at closure and at the conclusion of the case.

## 2024-04-01 NOTE — Anesthesia Procedure Notes (Signed)
 Procedure Name: Intubation Date/Time: 04/01/2024 11:01 AM  Performed by: Veronica Alm BROCKS, CRNAPre-anesthesia Checklist: Patient identified, Emergency Drugs available, Suction available and Patient being monitored Patient Re-evaluated:Patient Re-evaluated prior to induction Oxygen Delivery Method: Circle system utilized Preoxygenation: Pre-oxygenation with 100% oxygen Induction Type: IV induction Ventilation: Mask ventilation without difficulty Laryngoscope Size: McGrath and 3 Grade View: Grade I Tube type: Oral Tube size: 7.0 mm Number of attempts: 1 Airway Equipment and Method: Stylet and Oral airway Placement Confirmation: ETT inserted through vocal cords under direct vision, positive ETCO2 and breath sounds checked- equal and bilateral Secured at: 20 cm Tube secured with: Tape Dental Injury: Teeth and Oropharynx as per pre-operative assessment

## 2024-04-01 NOTE — Transfer of Care (Addendum)
 Immediate Anesthesia Transfer of Care Note  Patient: Sophia Schmidt  Procedure(s) Performed: REPAIR, HERNIA, PARAESOPHAGEAL, ROBOT-ASSISTED INSERTION OF MESH  Patient Location: PACU  Anesthesia Type:General  Level of Consciousness: drowsy  Airway & Oxygen Therapy: Patient Spontanous Breathing and Patient connected to face mask oxygen  Post-op Assessment: Report given to RN and Post -op Vital signs reviewed and stable  Post vital signs: Reviewed and stable  Last Vitals:  Vitals Value Taken Time  BP 147/79 04/01/24 13:00  Temp 36.6 C 04/01/24 13:00  Pulse 76 04/01/24 13:00  Resp 21 04/01/24 13:02  SpO2 100 % 04/01/24 13:00  Vitals shown include unfiled device data.  Last Pain:  Vitals:   04/01/24 0932  TempSrc: Temporal  PainSc: 0-No pain         Complications: No notable events documented.
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
## 2024-04-02 ENCOUNTER — Encounter: Payer: Self-pay | Admitting: Surgery

## 2024-04-02 DIAGNOSIS — K449 Diaphragmatic hernia without obstruction or gangrene: Secondary | ICD-10-CM | POA: Diagnosis not present

## 2024-04-02 LAB — CBC
HCT: 33.2 % — ABNORMAL LOW (ref 36.0–46.0)
Hemoglobin: 11.2 g/dL — ABNORMAL LOW (ref 12.0–15.0)
MCH: 26.7 pg (ref 26.0–34.0)
MCHC: 33.7 g/dL (ref 30.0–36.0)
MCV: 79 fL — ABNORMAL LOW (ref 80.0–100.0)
Platelets: 237 K/uL (ref 150–400)
RBC: 4.2 MIL/uL (ref 3.87–5.11)
RDW: 14.5 % (ref 11.5–15.5)
WBC: 10.9 K/uL — ABNORMAL HIGH (ref 4.0–10.5)
nRBC: 0 % (ref 0.0–0.2)

## 2024-04-02 LAB — BASIC METABOLIC PANEL WITH GFR
Anion gap: 6 (ref 5–15)
BUN: 12 mg/dL (ref 8–23)
CO2: 24 mmol/L (ref 22–32)
Calcium: 9.9 mg/dL (ref 8.9–10.3)
Chloride: 112 mmol/L — ABNORMAL HIGH (ref 98–111)
Creatinine, Ser: 0.62 mg/dL (ref 0.44–1.00)
GFR, Estimated: >60 mL/min (ref >60)
Glucose, Bld: 98 mg/dL (ref 70–99)
Potassium: 4 mmol/L (ref 3.5–5.1)
Sodium: 142 mmol/L (ref 135–145)

## 2024-04-02 LAB — SURGICAL PATHOLOGY

## 2024-04-02 MED ORDER — OXYCODONE HCL 5 MG PO TABS: 5.0000 mg | ORAL_TABLET | Freq: Four times a day (QID) | ORAL | 0 refills | Status: DC | PRN

## 2024-04-02 NOTE — Plan of Care (Signed)

## 2024-04-02 NOTE — TOC CM/SW Note (Signed)
 Transition of Care [facility]) - Inpatient Brief Assessment   Patient Details  Name: SONAM WANDEL MRN: [id number] Date of Birth: Mar 26, 1951  Transition of Care [facility]) CM/SW Contact:    Corean ONEIDA Haddock, RN Phone Number: 04/02/2024, 9:04 AM   Clinical Narrative:   Transition of Care [facility]) Screening Note   Patient Details  Name: LEEANNA SLABY Date of Birth: [birth year]   Transition of Care [facility]) CM/SW Contact:    Corean ONEIDA Haddock, RN Phone Number: 04/02/2024, 9:04 AM    Transition of Care Department [facility]) has reviewed patient and no TOC needs have been identified at this time. . If new patient transition needs arise, please place a TOC consult.    Transition of Care Asessment: Insurance and Status: Insurance coverage has been reviewed Patient has primary care physician: Yes     Prior/Current Home Services: No current home services Social Drivers of Health Review: SDOH reviewed no interventions necessary Readmission risk has been reviewed: No (obs status.  no score generated) Transition of care needs: no transition of care needs at this time

## 2024-04-02 NOTE — Discharge Instructions (Signed)
 In addition to included general post-operative instructions,  Diet: Recommend following Nissen diet restrictions for a minimum of 4 weeks; hand out given regarding this.   Activity: No heavy lifting >20 pounds (children, pets, laundry, garbage) or strenuous activity for 4 weeks, but light activity and walking are encouraged. Do not drive or drink alcohol if taking narcotic pain medications or having pain that might distract from driving.  Wound care: You may shower/get incision wet with soapy water and pat dry (do not rub incisions), but no baths or submerging incision underwater until follow-up.   Medications: Resume all home medications. For mild to moderate pain: acetaminophen  (Tylenol ) or ibuprofen /naproxen  (if no kidney disease). Combining Tylenol  with alcohol can substantially increase your risk of causing liver disease. Narcotic pain medications, if prescribed, can be used for severe pain, though may cause nausea, constipation, and drowsiness. Do not combine Tylenol  and Percocet (or similar) within a 6 hour period as Percocet (and similar) contain(s) Tylenol . If you do not need the narcotic pain medication, you do not need to fill the prescription.  Call office [phone] / [phone]) at any time if any questions, worsening pain, fevers/chills, bleeding, drainage from incision site, or other concerns.

## 2024-04-02 NOTE — Discharge Summary (Signed)
 [facility] SURGICAL DISCHARGE SUMMARY  Patient ID: Sophia Schmidt MRN: [id number] DOB/AGE: [birth year] 73 y.o.  Admit date: 04/01/2024 Discharge date: 04/02/2024  Discharge Diagnoses Patient Active Problem List   Diagnosis Date Noted   Hiatal hernia 04/01/2024   S/P repair of paraesophageal hernia 04/01/2024    Consultants None  Procedures 04/01/2024:  Robotic Assisted Laparoscopic Paraesophageal Hernia Repair; Partial Fundoplication   HPI: Sophia Schmidt is a 73 y.o. female with history of hiatal hernia and significant reflux who presents to [facility] for scheduled repair.   Hospital Course: Informed consent was obtained and documented, and patient underwent uneventful Robotic Assisted Laparoscopic Paraesophageal Hernia Repair; Partial Fundoplication (Dr Jordis, 04/01/2024).  Post-operatively, patient did very well. Advancement of patient's diet and ambulation were well-tolerated. The remainder of patient's hospital course was essentially unremarkable, and discharge planning was initiated accordingly with patient safely able to be discharged home with appropriate discharge instructions, pain control, and outpatient follow-up after all of her questions were answered to her expressed satisfaction.   Discharge Condition: Good   Physical Examination:  Constitutional: Well appearing female, NAD Pulmonary: Normal effort, no respiratory distress Gastrointestinal: Soft, incisional soreness, non-distended, no rebound/guarding  Skin: Laparoscopic incisions are CDI with dermabond, no erythema or drainage    Allergies as of 04/02/2024       Reactions   Penicillins Rash   Pt states  breaks her out        Medication List     TAKE these medications    acetaminophen  500 MG tablet Commonly known as: TYLENOL  Take 1,000 mg by mouth every 6 (six) hours as needed (pain.).   albuterol  108 (90 Base) MCG/ACT inhaler Commonly known as: VENTOLIN  HFA Inhale 2 puffs  into the lungs every 4 (four) hours as needed for wheezing or shortness of breath (cough).   [name]  63999 UNITS Cpep capsule Generic drug: lipase/protease/amylase Take 1-2 capsules by mouth See admin instructions. TAKE 2 CAPSULES BY MOUTH THREE TIMES DAILY WITH MEALS. MAY ALSO TAKE 1 CAPSULE AS NEEDED WITH SNACKS   D3 ADULT PO Take 2,000 Units by mouth in the morning.   oxyCODONE  5 MG immediate release tablet Commonly known as: Oxy IR/ROXICODONE  Take 1 tablet (5 mg total) by mouth every 6 (six) hours as needed for severe pain (pain score 7-10) or breakthrough pain.   VITAMIN B 12 PO Take 1 tablet by mouth in the morning.          Follow-up Information     Jordis Laneta FALCON, MD. Go on 04/14/2024.   Specialty: General Surgery Why: Go to appointment on 10/29 at 1000 AM Contact information: [address] [place] [address] [phone]                  Time spent on discharge management including discussion of hospital course, clinical condition, outpatient instructions, prescriptions, and follow up with the patient and members of the medical team: >30 minutes  -- Arthea Platt , PA-C [facility]  04/02/2024, 8:53 AM [phone] M-F: 7am - 4pm

## 2024-04-02 NOTE — Anesthesia Postprocedure Evaluation (Signed)
 Anesthesia Post Note  Patient: Sophia Schmidt  Procedure(s) Performed: REPAIR, HERNIA, PARAESOPHAGEAL, ROBOT-ASSISTED INSERTION OF MESH  Patient location during evaluation: PACU Anesthesia Type: General Level of consciousness: awake and alert Pain management: pain level controlled Vital Signs Assessment: post-procedure vital signs reviewed and stable Respiratory status: spontaneous breathing, nonlabored ventilation, respiratory function stable and patient connected to nasal cannula oxygen Cardiovascular status: blood pressure returned to baseline and stable Postop Assessment: no apparent nausea or vomiting Anesthetic complications: no   No notable events documented.   Last Vitals:  Vitals:   04/02/24 0357 04/02/24 0834  BP: (!) 143/78 (!) 177/89  Pulse: 78 77  Resp: 16 17  Temp: 37.4 C 37.2 C  SpO2: 96% 100%    Last Pain:  Vitals:   04/02/24 0834  TempSrc: Oral  PainSc:                  [place] KANDICE Clause

## 2024-04-07 ENCOUNTER — Telehealth: Payer: Self-pay | Admitting: Surgery

## 2024-04-07 NOTE — Telephone Encounter (Signed)
 Pt had surgery on 04-01-24 and says she is not having a bowel movement . Is that ok. Pt # [phone]

## 2024-04-14 ENCOUNTER — Ambulatory Visit (INDEPENDENT_AMBULATORY_CARE_PROVIDER_SITE_OTHER): Admitting: Physician Assistant

## 2024-04-14 ENCOUNTER — Encounter: Payer: Self-pay | Admitting: Surgery

## 2024-04-14 VITALS — BP 125/87 | HR 75 | Temp 98.3°F | Ht 63.0 in | Wt 146.0 lb

## 2024-04-14 DIAGNOSIS — Z09 Encounter for follow-up examination after completed treatment for conditions other than malignant neoplasm: Secondary | ICD-10-CM

## 2024-04-14 DIAGNOSIS — K449 Diaphragmatic hernia without obstruction or gangrene: Secondary | ICD-10-CM

## 2024-04-14 NOTE — Progress Notes (Signed)
 [facility] POST-OP OFFICE VISIT  04/14/2024  HPI: Sophia Schmidt is a 73 y.o. female 13 days s/p robotic assisted paraesophageal hernia repair and partial fundoplication with Dr Jordis  She is overall doing well She reports abdomen was sore for the first 5 days or so, now pain free She did have some trouble with swallowing for the first few days but this has completely resolved She is following Nissen diet restrictions; no issues No fever, chills, nausea, emesis  Incisions are healed well No other complaints   Vital signs: BP 125/87   Pulse 75   Temp 98.3 F (36.8 C) (Oral)   Ht 5' 3 (1.6 m)   Wt 146 lb (66.2 kg)   SpO2 98%   BMI 25.86 kg/m    Physical Exam: Constitutional: Well appearing female, NAD Abdomen: Soft, non-tender, non-distended, no rebound/guarding Skin: Laparoscopic incisions are healing well, dermabond starting to fall off, no erythema or drainage   Assessment/Plan: This is a 73 y.o. female 13 days s/p robotic assisted paraesophageal hernia repair and partial fundoplication with Dr Jordis   - She will continue to follow Nissen diet for the next 2 weeks; She has hand out for home   - Okay to continue to hold PPI   - Pain control prn; OTC medications should be sufficient   - Reviewed wound care recommendation  - Reviewed lifting restrictions; 4 weeks total  - We will see her again in 2-3 weeks; She, and her husband, understand to call with questions/concerns in the interim.   -- Arthea Platt, PA-C [facility] 04/14/2024, 10:38 AM M-F: 7am - 4pm

## 2024-04-14 NOTE — Patient Instructions (Signed)
 Continue with your soft diet for 2 more weeks. Then you may start to slowly advance what you eat.   Ok to return to work with lifting restrictions.   Follow-up with our office in 2 weeks.   Please call and ask to speak with a nurse if you develop questions or concerns.

## 2024-04-28 ENCOUNTER — Ambulatory Visit (INDEPENDENT_AMBULATORY_CARE_PROVIDER_SITE_OTHER): Admitting: Surgery

## 2024-04-28 ENCOUNTER — Encounter: Payer: Self-pay | Admitting: Surgery

## 2024-04-28 VITALS — BP 133/86 | HR 71 | Temp 98.5°F | Ht 63.0 in | Wt 148.8 lb

## 2024-04-28 DIAGNOSIS — Z09 Encounter for follow-up examination after completed treatment for conditions other than malignant neoplasm: Secondary | ICD-10-CM

## 2024-04-28 DIAGNOSIS — K449 Diaphragmatic hernia without obstruction or gangrene: Secondary | ICD-10-CM

## 2024-04-28 NOTE — Patient Instructions (Signed)

## 2024-04-28 NOTE — Progress Notes (Signed)
 Sophia Schmidt is 3 weeks out from robotic paraesophageal hernia with partial complications.  She is doing very well.  She is very adult nurse.  Cough is completely gone, dysphagia is also gone and there is no reflux.   PE NAD Abd: soft, incisions c/d/I, no rebound  A/P Doing very well Very appreciative May start liberalizing diet slowly RC 3 months

## 2024-05-03 ENCOUNTER — Encounter: Payer: Self-pay | Admitting: Surgery

## 2024-05-21 ENCOUNTER — Other Ambulatory Visit: Payer: Self-pay | Admitting: Family Medicine

## 2024-05-21 DIAGNOSIS — Z1231 Encounter for screening mammogram for malignant neoplasm of breast: Secondary | ICD-10-CM

## 2024-06-03 ENCOUNTER — Inpatient Hospital Stay: Admission: RE | Admit: 2024-06-03 | Discharge: 2024-06-03 | Attending: Family Medicine | Admitting: Family Medicine

## 2024-06-03 DIAGNOSIS — Z1231 Encounter for screening mammogram for malignant neoplasm of breast: Secondary | ICD-10-CM | POA: Diagnosis present

## 2024-07-19 ENCOUNTER — Encounter: Payer: Self-pay | Admitting: Family Medicine

## 2024-07-19 ENCOUNTER — Telehealth (INDEPENDENT_AMBULATORY_CARE_PROVIDER_SITE_OTHER): Admitting: Family Medicine

## 2024-07-19 DIAGNOSIS — J209 Acute bronchitis, unspecified: Secondary | ICD-10-CM | POA: Diagnosis not present

## 2024-07-19 MED ORDER — AZITHROMYCIN 250 MG PO TABS: ORAL_TABLET | ORAL | 0 refills | Status: AC

## 2024-07-19 MED ORDER — PREDNISONE 20 MG PO TABS: ORAL_TABLET | ORAL | 0 refills | Status: AC

## 2024-07-19 MED ORDER — GUAIFENESIN-CODEINE 100-10 MG/5ML PO SOLN: 5.0000 mL | Freq: Three times a day (TID) | ORAL | 0 refills | Status: AC | PRN

## 2024-08-04 ENCOUNTER — Ambulatory Visit: Admitting: Surgery

## 2025-04-01 ENCOUNTER — Ambulatory Visit
